# Patient Record
Sex: Female | Born: 1965 | Race: White | Hispanic: No | Smoking: Current every day smoker
Health system: Southern US, Community
[De-identification: ages and names within clinical notes are randomized; demographics above are authoritative.]

## PROBLEM LIST (undated history)

## (undated) DIAGNOSIS — F319 Bipolar disorder, unspecified: Secondary | ICD-10-CM

## (undated) DIAGNOSIS — K219 Gastro-esophageal reflux disease without esophagitis: Secondary | ICD-10-CM

## (undated) DIAGNOSIS — J449 Chronic obstructive pulmonary disease, unspecified: Secondary | ICD-10-CM

## (undated) DIAGNOSIS — R0600 Dyspnea, unspecified: Secondary | ICD-10-CM

## (undated) DIAGNOSIS — F32A Depression, unspecified: Secondary | ICD-10-CM

## (undated) DIAGNOSIS — G4730 Sleep apnea, unspecified: Secondary | ICD-10-CM

## (undated) DIAGNOSIS — I509 Heart failure, unspecified: Secondary | ICD-10-CM

## (undated) DIAGNOSIS — I340 Nonrheumatic mitral (valve) insufficiency: Secondary | ICD-10-CM

## (undated) DIAGNOSIS — J189 Pneumonia, unspecified organism: Secondary | ICD-10-CM

## (undated) DIAGNOSIS — F419 Anxiety disorder, unspecified: Secondary | ICD-10-CM

## (undated) DIAGNOSIS — I10 Essential (primary) hypertension: Secondary | ICD-10-CM

## (undated) DIAGNOSIS — I499 Cardiac arrhythmia, unspecified: Secondary | ICD-10-CM

## (undated) DIAGNOSIS — J45909 Unspecified asthma, uncomplicated: Secondary | ICD-10-CM

## (undated) DIAGNOSIS — M069 Rheumatoid arthritis, unspecified: Secondary | ICD-10-CM

## (undated) DIAGNOSIS — Z789 Other specified health status: Secondary | ICD-10-CM

## (undated) DIAGNOSIS — Z8719 Personal history of other diseases of the digestive system: Secondary | ICD-10-CM

## (undated) DIAGNOSIS — M797 Fibromyalgia: Secondary | ICD-10-CM

## (undated) DIAGNOSIS — F329 Major depressive disorder, single episode, unspecified: Secondary | ICD-10-CM

## (undated) DIAGNOSIS — Z9289 Personal history of other medical treatment: Secondary | ICD-10-CM

## (undated) DIAGNOSIS — D649 Anemia, unspecified: Secondary | ICD-10-CM

## (undated) DIAGNOSIS — R011 Cardiac murmur, unspecified: Secondary | ICD-10-CM

## (undated) DIAGNOSIS — I209 Angina pectoris, unspecified: Secondary | ICD-10-CM

## (undated) DIAGNOSIS — I2510 Atherosclerotic heart disease of native coronary artery without angina pectoris: Secondary | ICD-10-CM

## (undated) HISTORY — PX: CORONARY ANGIOPLASTY: SHX604

## (undated) HISTORY — PX: KNEE SURGERY: SHX244

## (undated) HISTORY — PX: CARDIAC CATHETERIZATION: SHX172

## (undated) HISTORY — PX: TONSILLECTOMY: SUR1361

## (undated) HISTORY — PX: ABDOMINAL HYSTERECTOMY: SHX81

---

## 1996-10-30 ENCOUNTER — Encounter (INDEPENDENT_AMBULATORY_CARE_PROVIDER_SITE_OTHER): Payer: Self-pay | Admitting: *Deleted

## 1996-10-30 LAB — CONVERTED CEMR LAB

## 1998-08-13 ENCOUNTER — Ambulatory Visit (HOSPITAL_COMMUNITY): Admission: RE | Admit: 1998-08-13 | Discharge: 1998-08-13 | Payer: Self-pay | Admitting: General Surgery

## 1998-08-13 ENCOUNTER — Encounter: Payer: Self-pay | Admitting: General Surgery

## 1998-11-02 ENCOUNTER — Encounter: Admission: RE | Admit: 1998-11-02 | Discharge: 1998-11-02 | Payer: Self-pay | Admitting: Family Medicine

## 1998-11-30 ENCOUNTER — Other Ambulatory Visit: Admission: RE | Admit: 1998-11-30 | Discharge: 1998-11-30 | Payer: Self-pay | Admitting: Obstetrics and Gynecology

## 1999-01-25 ENCOUNTER — Encounter: Admission: RE | Admit: 1999-01-25 | Discharge: 1999-01-25 | Payer: Self-pay | Admitting: Family Medicine

## 1999-02-08 ENCOUNTER — Encounter: Admission: RE | Admit: 1999-02-08 | Discharge: 1999-02-08 | Payer: Self-pay | Admitting: Family Medicine

## 1999-08-24 ENCOUNTER — Encounter: Admission: RE | Admit: 1999-08-24 | Discharge: 1999-08-24 | Payer: Self-pay | Admitting: Obstetrics and Gynecology

## 1999-08-24 ENCOUNTER — Encounter: Payer: Self-pay | Admitting: Obstetrics and Gynecology

## 2000-05-11 ENCOUNTER — Encounter: Admission: RE | Admit: 2000-05-11 | Discharge: 2000-05-11 | Payer: Self-pay | Admitting: Family Medicine

## 2000-05-29 ENCOUNTER — Other Ambulatory Visit: Admission: RE | Admit: 2000-05-29 | Discharge: 2000-05-29 | Payer: Self-pay | Admitting: Obstetrics and Gynecology

## 2000-07-24 ENCOUNTER — Encounter: Admission: RE | Admit: 2000-07-24 | Discharge: 2000-07-24 | Payer: Self-pay | Admitting: Sports Medicine

## 2000-09-03 ENCOUNTER — Encounter: Admission: RE | Admit: 2000-09-03 | Discharge: 2000-09-03 | Payer: Self-pay | Admitting: Family Medicine

## 2002-10-05 ENCOUNTER — Emergency Department (HOSPITAL_COMMUNITY): Admission: EM | Admit: 2002-10-05 | Discharge: 2002-10-06 | Payer: Self-pay | Admitting: Emergency Medicine

## 2002-10-06 ENCOUNTER — Encounter: Payer: Self-pay | Admitting: Emergency Medicine

## 2003-07-04 ENCOUNTER — Encounter: Payer: Self-pay | Admitting: Emergency Medicine

## 2003-07-04 ENCOUNTER — Inpatient Hospital Stay (HOSPITAL_COMMUNITY): Admission: EM | Admit: 2003-07-04 | Discharge: 2003-07-07 | Payer: Self-pay | Admitting: Emergency Medicine

## 2004-07-11 ENCOUNTER — Inpatient Hospital Stay (HOSPITAL_COMMUNITY): Admission: AD | Admit: 2004-07-11 | Discharge: 2004-07-15 | Payer: Self-pay | Admitting: Psychiatry

## 2004-07-11 ENCOUNTER — Ambulatory Visit: Payer: Self-pay | Admitting: Psychiatry

## 2006-12-28 ENCOUNTER — Encounter (INDEPENDENT_AMBULATORY_CARE_PROVIDER_SITE_OTHER): Payer: Self-pay | Admitting: *Deleted

## 2010-08-19 ENCOUNTER — Emergency Department (HOSPITAL_COMMUNITY): Admission: EM | Admit: 2010-08-19 | Discharge: 2010-08-20 | Payer: Self-pay | Admitting: Emergency Medicine

## 2010-09-23 ENCOUNTER — Emergency Department (HOSPITAL_COMMUNITY): Admission: EM | Admit: 2010-09-23 | Discharge: 2010-09-23 | Payer: Self-pay | Admitting: Emergency Medicine

## 2010-11-06 ENCOUNTER — Inpatient Hospital Stay (HOSPITAL_COMMUNITY)
Admission: EM | Admit: 2010-11-06 | Discharge: 2010-11-09 | Payer: Self-pay | Source: Home / Self Care | Attending: Internal Medicine | Admitting: Internal Medicine

## 2010-11-14 LAB — BASIC METABOLIC PANEL
BUN: 6 mg/dL (ref 6–23)
BUN: 13 mg/dL (ref 6–23)
CO2: 23 mEq/L (ref 19–32)
CO2: 29 mEq/L (ref 19–32)
Calcium: 9.2 mg/dL (ref 8.4–10.5)
Calcium: 9.3 mg/dL (ref 8.4–10.5)
Chloride: 100 mEq/L (ref 96–112)
Chloride: 99 mEq/L (ref 96–112)
Creatinine, Ser: 0.86 mg/dL (ref 0.4–1.2)
Creatinine, Ser: 0.99 mg/dL (ref 0.4–1.2)
GFR calc Af Amer: >60 mL/min (ref >60)
GFR calc Af Amer: >60 mL/min (ref >60)
GFR calc non Af Amer: >60 mL/min (ref >60)
GFR calc non Af Amer: >60 mL/min (ref >60)
Glucose, Bld: 103 mg/dL — ABNORMAL HIGH (ref 70–99)
Glucose, Bld: 137 mg/dL — ABNORMAL HIGH (ref 70–99)
Potassium: 3.2 mEq/L — ABNORMAL LOW (ref 3.5–5.1)
Potassium: 4.8 mEq/L (ref 3.5–5.1)
Sodium: 134 mEq/L — ABNORMAL LOW (ref 135–145)
Sodium: 136 mEq/L (ref 135–145)

## 2010-11-14 LAB — URINALYSIS, ROUTINE W REFLEX MICROSCOPIC
Bilirubin Urine: NEGATIVE
Hgb urine dipstick: NEGATIVE
Ketones, ur: NEGATIVE mg/dL
Nitrite: NEGATIVE
Protein, ur: NEGATIVE mg/dL
Specific Gravity, Urine: 1.014 (ref 1.005–1.030)
Urine Glucose, Fasting: NEGATIVE mg/dL
Urobilinogen, UA: 0.2 mg/dL (ref 0.0–1.0)
pH: 5.5 (ref 5.0–8.0)

## 2010-11-14 LAB — CBC
HCT: 39.3 % (ref 36.0–46.0)
HCT: 37.5 % (ref 36.0–46.0)
Hemoglobin: 12.9 g/dL (ref 12.0–15.0)
Hemoglobin: 12.2 g/dL (ref 12.0–15.0)
MCH: 30.6 pg (ref 26.0–34.0)
MCH: 30.7 pg (ref 26.0–34.0)
MCHC: 32.8 g/dL (ref 30.0–36.0)
MCHC: 32.5 g/dL (ref 30.0–36.0)
MCV: 93.1 fL (ref 78.0–100.0)
MCV: 94.2 fL (ref 78.0–100.0)
Platelets: 401 10*3/uL — ABNORMAL HIGH (ref 150–400)
Platelets: 395 10*3/uL (ref 150–400)
RBC: 4.22 MIL/uL (ref 3.87–5.11)
RBC: 3.98 MIL/uL (ref 3.87–5.11)
RDW: 13.6 % (ref 11.5–15.5)
RDW: 13.6 % (ref 11.5–15.5)
WBC: 6.3 10*3/uL (ref 4.0–10.5)
WBC: 4.9 10*3/uL (ref 4.0–10.5)

## 2010-11-14 LAB — COMPREHENSIVE METABOLIC PANEL
ALT: 15 U/L (ref 0–35)
AST: 18 U/L (ref 0–37)
Albumin: 3.1 g/dL — ABNORMAL LOW (ref 3.5–5.2)
Alkaline Phosphatase: 93 U/L (ref 39–117)
BUN: 12 mg/dL (ref 6–23)
CO2: 28 mEq/L (ref 19–32)
Calcium: 9.3 mg/dL (ref 8.4–10.5)
Chloride: 100 mEq/L (ref 96–112)
Creatinine, Ser: 0.98 mg/dL (ref 0.4–1.2)
GFR calc Af Amer: >60 mL/min (ref >60)
GFR calc non Af Amer: >60 mL/min (ref >60)
Glucose, Bld: 150 mg/dL — ABNORMAL HIGH (ref 70–99)
Potassium: 4.3 mEq/L (ref 3.5–5.1)
Sodium: 137 mEq/L (ref 135–145)
Total Bilirubin: 0.4 mg/dL (ref 0.3–1.2)
Total Protein: 7.2 g/dL (ref 6.0–8.3)

## 2010-11-14 LAB — DIFFERENTIAL
Basophils Absolute: 0 10*3/uL (ref 0.0–0.1)
Basophils Relative: 0 % (ref 0–1)
Eosinophils Absolute: 0 10*3/uL (ref 0.0–0.7)
Eosinophils Relative: 0 % (ref 0–5)
Lymphocytes Relative: 40 % (ref 12–46)
Lymphs Abs: 2.5 10*3/uL (ref 0.7–4.0)
Monocytes Absolute: 0.9 K/uL (ref 0.1–1.0)
Monocytes Relative: 13 % — ABNORMAL HIGH (ref 3–12)
Neutro Abs: 3 10*3/uL (ref 1.7–7.7)
Neutrophils Relative %: 47 % (ref 43–77)

## 2010-11-14 LAB — POCT PREGNANCY, URINE: Preg Test, Ur: NEGATIVE

## 2010-11-14 LAB — MAGNESIUM: Magnesium: 1.9 mg/dL (ref 1.5–2.5)

## 2010-11-14 LAB — POCT CARDIAC MARKERS
CKMB, poc: <1 ng/mL — ABNORMAL LOW (ref 1.0–8.0)
Myoglobin, poc: 54.7 ng/mL (ref 12–200)
Troponin i, poc: <0.05 ng/mL (ref 0.00–0.09)

## 2010-11-14 LAB — ETHANOL: Alcohol, Ethyl (B): <5 mg/dL (ref 0–10)

## 2010-11-14 LAB — RAPID URINE DRUG SCREEN, HOSP PERFORMED
Amphetamines: NOT DETECTED
Barbiturates: NOT DETECTED
Benzodiazepines: NOT DETECTED
Cocaine: NOT DETECTED
Opiates: POSITIVE — AB
Tetrahydrocannabinol: POSITIVE — AB

## 2010-11-14 LAB — BRAIN NATRIURETIC PEPTIDE: Pro B Natriuretic peptide (BNP): <30 pg/mL (ref 0.0–100.0)

## 2010-11-14 LAB — POTASSIUM: Potassium: 4 mEq/L (ref 3.5–5.1)

## 2010-12-11 NOTE — H&P (Signed)
Lydia [name], MCMANIGAL NO.:  [id number]  MEDICAL RECORD NO.:  [id number]          PATIENT TYPE:  EMS  LOCATION:  MAJO                         FACILITY:  [facility]  PHYSICIAN:  Baltazar Najjar, MD     DATE OF BIRTH:  [birth year]  DATE OF ADMISSION:  11/06/2010 DATE OF DISCHARGE:                             HISTORY & PHYSICAL   PRIMARY CARE PHYSICIAN:  Dr. Alver Sorrow.  CODE STATUS:  The patient is a full code.  HISTORY OF PRESENT ILLNESS:  Ms. Lydia [name] is a 45 year old woman with history of COPD and major depression, presented to the ER today with chief complaints of shortness of breath and cough productive of greenish sputum for a couple of days.  She denies any fever, however, stated that she has cold sweats on and off.  Denies any chest pain.  The patient also stated that she had been having hallucination lately since she ran out of her medications since the loss of insurance.  She described her hallucination as hearing voices and seeing people.  She has no psychiatric; however as per, her she was taking medication for major depressive disorder and she ran out of meds for about 2 weeks. She denies any suicidal ideation, any plans to harm herself at this time.  PAST MEDICAL HISTORY: 1. Significant for COPD. 2. Hypertension. 3. Major depressive disorder. 4. Fibromyalgia. 5. History of CHF. 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  SURGICAL HISTORY:  History of hysterectomy.  SOCIAL HISTORY:  She lives with her son and his girlfriend.  Denies drinking or drug abuse.  She also denies any smoking, however as per ER record, she is a current smoker.  REVIEW OF SYSTEMS:  CHEST:  Significant for shortness of breath and cough productive of greenish sputum as above.  CARDIOVASCULAR:  No chest pain.  She does have palpitation.  ABDOMEN: Complaining of nausea with no vomiting.  No abdominal pain.  No change in her  bowel habits. CONSTITUTIONAL:  Significant for cold sweats without any fever.  GU: No dysuria, no flank pain.  ALLERGIES:  No known drug allergies.  HOME MEDICATION: 1. Effect ER 300 mg q.a.m. 2. Risperdal 2 mg at bedtime. 3. Lasix 20 mg daily. 4. Lisinopril 5 mg daily. 5. Crestor unknown dose. 6. TriCor unknown dose. 7. Flexeril 10 mg b.i.d. p.r.n. 8. Prilosec 20 mg. 9. Cogentin unknown dose. 10.Albuterol inhaler p.r.n. 11.Amitriptyline unknown dose.  PHYSICAL EXAMINATION:  VITAL SIGNS: Blood pressure 129/99, pulse 115, temperature 97.9, O2 sat 99% on 2 L, oxygen via nasal cannula. GENERAL:  She is alert, not in acute distress. LUNGS: She has scattered wheezing noted with no rales. CARDIOVASCULAR:  S1, S2.  Regular rhythm, tachycardic. ABDOMEN: Obese, soft, nontender. EXTREMITIES:  No pedal edema.  LABORATORY DATA:  Troponin less than 0.05.  Urine drug screen is positive for opiates and THC.  Urinalysis unremarkable.  BMP less than 30, potassium 3.2, sodium 134, BUN 6, creatinine 0.8, calcium 9.2, alcohol level less than 5, WBC 6.3, hemoglobin 12.9, hematocrit 39.3, platelet count 401.  Radiology chest x-ray  showed no acute cardiopulmonary abnormalities.  ASSESSMENT/PLAN:  Ms. Lydia [name] is a 45 year old woman with history of chronic obstructive pulmonary disease and major depressive disorder, presented with shortness of breath, productive cough, and hallucinations. 1. Chronic obstructive pulmonary disease exacerbation.  The patient     will be admitted to telemetry floor given her tachycardia.  Her EKG     actually showed sinus tachycardia which is probably secondary to     inhalers.  We will place her on Xopenex inhaler because of     tachycardia q.2 h. p.r.n. for wheezing. 2. We will start Solu-Medrol 40 mg IV q.8 h. 3. We will start Zithromax 500 mg IV daily. 4. Depression with history of major depressive disorder.  The patient     is having symptoms because she  ran out of her meds. We will place     her on her home medications.  We will consult Psychiatry Service in     the morning.  There is no suicidal ideation at this time. 5. Hypertension, currently controlled.  We will continue with her home     regimen and adjust as needed. 6. Fibromyalgia, continue home medications. 7. Code status:  The patient is full code.          ______________________________ Baltazar Najjar, MD     SA/MEDQ  D:  11/06/2010  T:  11/06/2010  Job:  [id number]  cc:   Elby Showers Dr. Clovis Riley  Electronically Signed by Hannah Beat MD on 12/11/2010 02:40:55 PM

## 2011-01-11 LAB — BASIC METABOLIC PANEL
BUN: 2 mg/dL — ABNORMAL LOW (ref 6–23)
CO2: 23 mEq/L (ref 19–32)
Calcium: 8.8 mg/dL (ref 8.4–10.5)
Chloride: 102 mEq/L (ref 96–112)
Creatinine, Ser: 0.77 mg/dL (ref 0.4–1.2)
GFR calc Af Amer: >60 mL/min (ref >60)
GFR calc non Af Amer: >60 mL/min (ref >60)
Glucose, Bld: 112 mg/dL — ABNORMAL HIGH (ref 70–99)
Potassium: 2.7 mEq/L — CL (ref 3.5–5.1)
Sodium: 135 mEq/L (ref 135–145)

## 2011-01-11 LAB — CBC
HCT: 35.1 % — ABNORMAL LOW (ref 36.0–46.0)
Hemoglobin: 12 g/dL (ref 12.0–15.0)
MCH: 32.5 pg (ref 26.0–34.0)
MCHC: 34.2 g/dL (ref 30.0–36.0)
MCV: 95.1 fL (ref 78.0–100.0)
Platelets: 318 10*3/uL (ref 150–400)
RBC: 3.69 MIL/uL — ABNORMAL LOW (ref 3.87–5.11)
RDW: 13.8 % (ref 11.5–15.5)
WBC: 9.1 10*3/uL (ref 4.0–10.5)

## 2011-01-11 LAB — DIFFERENTIAL
Basophils Absolute: 0 10*3/uL (ref 0.0–0.1)
Basophils Relative: 0 % (ref 0–1)
Eosinophils Absolute: 0 10*3/uL (ref 0.0–0.7)
Eosinophils Relative: 0 % (ref 0–5)
Lymphocytes Relative: 35 % (ref 12–46)
Lymphs Abs: 3.2 10*3/uL (ref 0.7–4.0)
Monocytes Absolute: 0.9 10*3/uL (ref 0.1–1.0)
Monocytes Relative: 9 % (ref 3–12)
Neutro Abs: 5 10*3/uL (ref 1.7–7.7)
Neutrophils Relative %: 55 % (ref 43–77)

## 2011-01-11 LAB — POCT CARDIAC MARKERS
CKMB, poc: <1 ng/mL — ABNORMAL LOW (ref 1.0–8.0)
Myoglobin, poc: 84.9 ng/mL (ref 12–200)
Troponin i, poc: <0.05 ng/mL (ref 0.00–0.09)

## 2011-01-11 LAB — RAPID STREP SCREEN (MED CTR MEBANE ONLY): Streptococcus, Group A Screen (Direct): NEGATIVE

## 2011-03-17 NOTE — H&P (Signed)
NAME:  Lydia [name], Lydia [name]                          ACCOUNT NO.:  [id number]   MEDICAL RECORD NO.:  [id number]                   PATIENT TYPE:  EMS   LOCATION:  MAJO                                 FACILITY:  [facility]   PHYSICIAN:  Elliot Cousin, M.D.                 DATE OF BIRTH:  [birth year]   DATE OF ADMISSION:  07/03/2003  DATE OF DISCHARGE:                                HISTORY & PHYSICAL   CHIEF COMPLAINT:  Drug overdose.   HISTORY OF PRESENT ILLNESS:  Ms. Ranes is a 45 year old woman with a past  medical history significant for a past suicide attempt and drug overdose,  who presented to the emergency department via EMS after her daughter found  her unresponsive at home.  The patient is unable to provide the history.  The history is provided by the nursing staff in the emergency department and  the emergency department physician.  The patient has a known history of  depression and prior suicide attempt three to four years ago.  She was  apparently admitted to [facility] or another local hospital  specifically for psychiatric reasons and the prior suicide attempt.  Apparently her past medical history is significant for fibromyalgia as well.  The EMS report is unavailable; however, the patient's daughter retrieved two  pill bottles, one with cyclobenzaprine 10 mg to be taken three times a day.  The original prescription was written on June 03, 2003, and the bottle is  empty.  There were 30 tablets in the bottle dispensed and again now it is  empty.  The other pill bottle found, which was also empty, was clonazepam  0.5 mg to be taken twice a day.  The number dispensed on the clonazepam was  #60 and the date of the original prescription was April 21, 2003.  When the  patient arrived to the emergency department, she was apparently lethargic  but responsive to stimuli.  Apparently she was very agitated when aroused.  The patient threatened the staff verbally and physically when  trying to  subdue the patient, and also when trying to insert a Foley catheter.  Her  vital signs on arrival was a temperature of 96.6, blood pressure 152/75,  pulse of 163, respiratory rate of 24, and oxygen saturation of 100% on 2  liters.  The patient is currently sleeping and snoring, and in no acute  distress.   The patient's urine drug screen is positive for THC and benzodiazepine.   PAST MEDICAL HISTORY:  (Based on history from the emergency department  nursing staff, who interviewed the family)  1. History of suicide attempt approximately three to four years ago     prompting inpatient treatment.  2. Depression.  3. Fibromyalgia.  4. Marijuana use.  5. Tobacco abuse (two to three packs of cigarettes per day).   MEDICATIONS:  1. Flexeril 10 mg t.i.d. p.r.n.  2. Clonazepam 0.5  mg b.i.d.   ALLERGIES:  Unknown at this time.   SOCIAL HISTORY:  Only known social history at this time is that the patient  does have one daughter.  Marital status and place of residence unknown at  this time.   FAMILY HISTORY:  Unavailable.   REVIEW OF SYSTEMS:  Unavailable.   PHYSICAL EXAMINATION:  CURRENT VITALS:  Temperature 96.6, blood pressure  101/62, respiratory rate 16, pulse 115, oxygen saturation 95% on 2 liters  nasal cannula oxygen.  GENERAL:  The patient is a mildly obese 45 year old white female who is  currently sleeping and snoring.  She is in no acute distress.  HEENT:  Head is normocephalic, nontraumatic.  Pupils, when the eyes are  passively opened, are reactive to light, equal and round.  Extraocular  movements could not be assessed.  Conjunctivae are clear.  Sclerae white.  Oropharynx reveals mildly dry mucous membranes, otherwise no lesions seen.  NECK:  Supple without any adenopathy or thyromegaly.  No bruit or JVD.  LUNGS:  Clear to auscultation bilaterally but with decreased breath sounds  in the bases.  HEART:  S1, S2 with tachycardia.  No rubs, no gallops.   ABDOMEN:  Obese, positive bowel sounds, soft, nontender, nondistended, no  hepatosplenomegaly.  GU:  There is a Foley catheter draining yellow urine.  RECTAL:  Deferred.  EXTREMITIES:  The patient's pedal pulses are 2+ bilaterally.  No gross joint  abnormalities.  No pretibial edema.  No pedal edema.  She does have a few  calluses bilaterally of her plantar surfaces.  NEUROLOGIC:  The patient is obtunded.  She will attempt to open her eyes  with stimulation.  She will attempt to move all of her extremities with  stimulation, however, she is in four-point restraints.  Prior to the four-  point restraints the patient was moving her extremities voluntarily and  without any specific movement disorders.  The patient is unresponsive to  verbal stimuli.  She does not follow commands.   ADMISSION LABORATORIES:  Urine drug screen negative opiates, negative  cocaine, positive benzodiazepines, negative barbiturates, negative  amphetamines, positive THC.  Acetaminophen less than 10.  Salicylates less  than 4.  Alcohol less than 5.  Urinalysis is within normal limits.  WBC 10,  hemoglobin 11.8, hematocrit 34.8, MCV 94.8, platelets 295.  Sodium 140,  potassium 2.7, chloride 107, CO2 26, glucose 157, BUN 2, creatinine 0.6,  calcium 8.3, total protein 6.4, albumin 3.3, AST 17, ALT 10, alkaline  phosphatase 74, total bilirubin 0.5.   EKG shows sinus tachycardia with sinus arrhythmia.  QRS 83, QT 321, QTC 440.  Chest x-ray shows low inspiratory volumes, negative for infiltrates.   ASSESSMENT:  1. Multi-drug overdose with probable attempted suicide.  The patient appears     to be protecting her airway without any difficulties.  She is snoring,     however.  She is virtually unresponsive except for stimuli.  She is     oxygenating well on room air initially and now on 2 liters nasal cannula    oxygen.  She is currently tachycardic without QRS widening on EKG and/or     without increase in the QTC  intervals.  The Flexeril will mimic tricyclic     antidepressants in their effects with drug overdose per Poison Control.     Therefore, the Flexeril could potentially cause tachyarrhythmias,     hypertension, and seizures.  The clonazepam will primarily cause sedation     and possibly  respiratory depression.  2. Hypokalemia.  The cause of the hypokalemia is unknown at this time.  3. Mild normocytic anemia.  4. Tachycardia secondary to #1.   PLAN:  1. The patient will be admitted to the Intensive Care Unit for close     observation.  2. The patient was given approximately a liter of normal saline in the     emergency department.  IV fluids will continue with half normal saline     with an amp of bicarbonate and 20 mEq potassium chloride at 75 mL an     hour.  Potassium chloride IV 10 mEq runs will be given simultaneously at     100 mL of normal saline total per hour x5.  3. Will obtain a tricyclic antidepressant level as well as a urine hCG     level.  4. Will obtain neuro checks every two hours x12 hours and then every four     hours thereafter.  5. Will check a magnesium level.  6. Will follow up on the potassium level later on in the day to assess the     extent of the hypokalemia.  7. Will repeat an EKG later on in the morning.  8. Will obtain psychiatric consultation.  9. Will try to obtain more history from the family and significant others     over the next few hours on into the following morning.                                                Elliot Cousin, M.D.    DF/MEDQ  D:  07/04/2003  T:  07/04/2003  Job:  [id number]

## 2011-03-17 NOTE — Discharge Summary (Signed)
NAME:  Lydia [name], Lydia [name]                          ACCOUNT NO.:  [id number]   MEDICAL RECORD NO.:  [id number]                   PATIENT TYPE:  INP   LOCATION:  3023                                 FACILITY:  [facility]   PHYSICIAN:  Elliot Cousin, M.D.                 DATE OF BIRTH:  11-Jun-1966   DATE OF ADMISSION:  07/03/2003  DATE OF DISCHARGE:  07/07/2003                                 DISCHARGE SUMMARY   DISCHARGE DIAGNOSES:  1. Multidrug overdose with Klonopin, Effexor, Flexeril and marijuana.  2. A major depressive episode.  3. Generalized anxiety disorder.  4. Urinary tract infection.  5. Hypokalemia.  6. Tobacco abuse.  7. Marijuana abuse.  8. Chronic normocytic anemia.  9. Allergic rhinitis.  10.      Fibromyalgia.  11.      Lupus diagnosed in March of 2004.  12.      Chronic obstructive pulmonary disease.  13.      Status post bilateral tubal ligation in the past.  14.      Status post total abdominal hysterectomy secondary to endometriosis     in the past.  15.      Status post bladder surgery in the past.   DISCHARGE MEDICATIONS:  1. Effexor XR 75 mg daily for three to seven days and then increase by 75 mg     daily for a total of 300 mg per day.  2. Levaquin 500 mg daily x3 days.  3. Flonase nasal spray one spray in each nostril twice a day.   DISCHARGE DISPOSITION:  Lydia [name] was discharged to home on July 07, 2003 in improved and stable condition.  She will follow up with her primary  care physician, Cheri Rous, M.D. in one week.  She was advised to follow  up with her scheduled appointment at mental health on July 09, 2003 at  12:30 p.m.   CONSULTATIONS:  Antonietta Breach, M.D., psychiatrist.   HISTORY OF PRESENT ILLNESS:  Lydia [name] is a 45 year old woman with a past  medical history significant for previous suicide attempt with a drug  overdose, depression, and fibromyalgia who presented to the emergency  department on July 03, 2003 via EMS  after her daughter found her  unresponsive at home.  The patient was unable to provide a history during  the initial assessment.  When she was evaluated in the emergency department,  she was somewhat lethargic.  However, with stimulation she became  belligerent and actually verbally and physically threatening to the  emergency department staff.  When the patient was not stimulated she was  virtually unresponsive.  Her airway was managed without any intervention.  She was oxygenating in the upper 90s on room air.  She was afebrile on exam  initially in the emergency department.  Her blood pressure initially was  152/75 in the emergency department.  Her pulse was initially  163 with a  respiratory rate of 24.  The urine drug screen was positive for THC and  benzodiazepines.  The serum acetaminophen level was less than 10.  The  salicylate level was less than 4.  The urine tricyclic level was positive.  The patient's initial EKG revealed sinus tachycardia with a QRS of 83, a QT  of 321 and a QTc of 440.  The patient was therefore admitted to the ICU for  evaluation and management of multidrug overdose.   HOSPITAL COURSE:  Problem 1.  MULTIDRUG OVERDOSE SECONDARY TO MAJOR  DEPRESSION:  As stated above, the patient's urine drug screen was positive  for benzodiazepines, tricyclic antidepressant, and THC.  When the patient  became more alert, she admitted to being angry with life.  She admitted  taking 16 Klonopin, 4 Effexor, 1 Mobic, and 10 Flexeril.  The initial  management started in the emergency department where the patient was given a  liter of normal saline.  The patient was continued treatment on IV fluids in  the ICU with 1/2 normal saline with an ampere of bicarbonate and 20 mEq  potassium chloride.  Per poison control, the Flexeril can mimic a tricyclic  antidepressant in its effect.  The Effexor also currently had tricyclic  effects as well.  The patient's EKG was monitored twice  during the first 24  hours and daily thereafter.  She was treated supportively with oxygen  therapy and neurological checks every two hours x12 hours and then every  four hours x12 hours.  She was also provided with a sitter.   The initial EKG revealed a QRS of 83, a QT of 321, and a QTc of 440.  However, with the next EKG the patient's QRS duration increased to 86, the  QT increased to 424, and the QTc increased to 486.  Given the findings of  the repeat EKG, the IV fluids were changed to 1/4 normal saline with two  amperes of bicarb at 100 mL an hour.  On hospital day #3, the QRS slightly  decreased to 84 and the QT decreased to 422 and the QTc decreased to 471.   On hospital day #2 late in the evening, the patient became more alert and  less lethargic.  On initial exam which is located in the History and  Physical, the patient was somewhat obtunded and only responded to aggressive  stimulation.  During the later hours of hospital day #2, the patient became  more alert and talkative.  She was eventually transferred out of the ICU.  A  psychiatry consult was obtained from Antonietta Breach, M.D.  Dr. Providence Crosby  assessment occurred on hospital day #3.  Per his assessment the patient was  no longer at risk of harming herself, in fact she wanted to go back home to  her 47 year old.  She voiced regret regarding the attempted suicide.  Dr.  Jeanie Sewer did recommend inpatient psychiatric evaluation.  However, the  patient desired to care for her 47 year old.  The patient was subsequently  set up for outpatient treatment with mental health per Dr. Jeanie Sewer.  The  patient was scheduled to be seen two days after discharge.  Prior to  hospital discharge, the patient was alert and oriented.  She was restarted  on the Effexor at 75 mg daily.  Per Dr. Providence Crosby recommendations, the  Effexor should be titrated upward each day by 75 mg for a total of 300 mg during the first seven days.  The patient  was advised to follow up with her  primary care physician in one week to monitor her blood pressures on the  increased dose of Effexor.  The patient remained hemodynamically stable  during the hospitalization.  Her respiratory rate, heart rate, and blood  pressures all normalized during the hospital stay.  It is important to note  that a urine pregnancy test was assessed during the hospitalization and it  was negative.   Problem 2.  HYPOKALEMIA:  The patient's initial potassium level was 2.7.  The etiology of the hypokalemia is unknown and was unknown during  hospitalization.  The patient had no previous history of therapy with  diuretics.  A magnesium level was assessed and found to be within normal  limits at 2.1.  The patient was repleted with potassium intravenously for  the first 24 hours and by mouth thereafter.  The patient's potassium prior  to hospital discharge was 3.8.   Problem 3.  NORMOCYTIC ANEMIA:  The patient's hemoglobin ranged between 10.3  and 11.8 during hospital course.  The hematocrit ranged between 30.6 and  34.8 during the hospitalization.  She is status post a total abdominal  hysterectomy in the past, therefore she does not have any menstrual blood  flow.  No further evaluation or treatment were given during the  hospitalization.  However, an outpatient colonoscopy is recommended.   Problem 4.  URINARY TRACT INFECTION:  The patient did have a Foley catheter  for the first 48 hours of hospitalization.  When it was discontinued, the  patient complained of mild dysuria.  A urinalysis revealed many  bacteria but negative for urine leukocyte esterase and negative for  nitrites.  She did have 3-6 urine white blood cells, however.  The patient  was started on Levaquin 500 mg daily.  She was advised to continue the  Levaquin for an additional three days in the outpatient setting.                                                Elliot Cousin, M.D.    DF/MEDQ  D:   07/11/2003  T:  07/12/2003  Job:  [id number]   cc:   Cheri Rous  [address]  [place]  [place] [address]  Fax: [phone]
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
## 2011-08-25 ENCOUNTER — Encounter (HOSPITAL_COMMUNITY)
Admission: RE | Admit: 2011-08-25 | Discharge: 2011-08-25 | Disposition: A | Discharge status: Home / Self Care | Payer: Medicare Other | Source: Ambulatory Visit | Attending: Oral Surgery | Admitting: Oral Surgery

## 2011-08-25 LAB — BASIC METABOLIC PANEL
CO2: 28 mEq/L (ref 19–32)
Chloride: 106 mEq/L (ref 96–112)
Creatinine, Ser: 0.83 mg/dL (ref 0.50–1.10)
Glucose, Bld: 92 mg/dL (ref 70–99)

## 2011-08-25 LAB — CBC
HCT: 35.2 % — ABNORMAL LOW (ref 36.0–46.0)
Hemoglobin: 11.4 g/dL — ABNORMAL LOW (ref 12.0–15.0)
MCV: 98.3 fL (ref 78.0–100.0)
Platelets: 337 10*3/uL (ref 150–400)
RBC: 3.58 MIL/uL — ABNORMAL LOW (ref 3.87–5.11)
WBC: 10.3 10*3/uL (ref 4.0–10.5)

## 2011-08-25 LAB — SURGICAL PCR SCREEN: MRSA, PCR: NEGATIVE

## 2011-08-28 ENCOUNTER — Ambulatory Visit (HOSPITAL_COMMUNITY)
Admission: RE | Admit: 2011-08-28 | Discharge: 2011-08-28 | Disposition: A | Discharge status: Home / Self Care | Payer: Medicare Other | Source: Ambulatory Visit | Attending: Oral Surgery | Admitting: Oral Surgery

## 2011-08-28 DIAGNOSIS — G8929 Other chronic pain: Secondary | ICD-10-CM | POA: Insufficient documentation

## 2011-08-28 DIAGNOSIS — I10 Essential (primary) hypertension: Secondary | ICD-10-CM | POA: Insufficient documentation

## 2011-08-28 DIAGNOSIS — K029 Dental caries, unspecified: Secondary | ICD-10-CM | POA: Insufficient documentation

## 2011-08-28 DIAGNOSIS — Z01812 Encounter for preprocedural laboratory examination: Secondary | ICD-10-CM | POA: Insufficient documentation

## 2011-08-28 DIAGNOSIS — J449 Chronic obstructive pulmonary disease, unspecified: Secondary | ICD-10-CM | POA: Insufficient documentation

## 2011-08-28 DIAGNOSIS — J4489 Other specified chronic obstructive pulmonary disease: Secondary | ICD-10-CM | POA: Insufficient documentation

## 2011-08-28 NOTE — Op Note (Signed)
NAME:  Lydia [name], Lydia [name]                ACCOUNT NO.:  [id number]  MEDICAL RECORD NO.:  [id number]  LOCATION:  SDSC                         FACILITY:  [facility]  PHYSICIAN:  Georgia Lopes, M.D.  DATE OF BIRTH:  Mar 15, 1966  DATE OF PROCEDURE:  08/28/2011 DATE OF DISCHARGE:                              OPERATIVE REPORT   PREOPERATIVE DIAGNOSES:  Nonrestorable teeth numbers 12, 13, 14, 15, 21, 22, 23, 25, 26, 27, 28, 29, maxillary alveolar bone contour irregularity.  POSTOPERATIVE DIAGNOSES:  Nonrestorable teeth numbers 12, 13, 14, 15, 21, 22, 23, 25, 26, 27, 28, 29, maxillary alveolar bone contour irregularity.  PROCEDURE:  Extraction of teeth numbers 12, 13, 14, 15, 21, 22, 23, 25, 26, 27, 28, 29, alveoplasty right and left maxilla and mandible.  SURGEON:  Georgia Lopes, MD  ANESTHESIA:  General nasal.  ASSISTANTS:  Fraser Din and Lodema Hong.  INDICATIONS FOR PROCEDURE:  [name] is a 45 year old female who has morbid obesity, COPD, hypertension, chronic pain, fibromyalgia, history of angina, chest pain, migraines, congestive heart failure, hiatal hernia, reflux GI ulcer with bleeding, history of renal failure, depression, and psychiatric suicide attempts.  She was referred to me by general dentist for removal of all remaining teeth in preparation for dentures.  Because of her significant past medical history and the extensiveness of the __________ performed, I recommended that she undergo general anesthesia with airway protection via endotracheal tube.  PROCEDURE:  The patient was taken to the operating room, placed on the table in supine position.  General anesthesia was administered intravenously and a nasal endotracheal tube was placed and secured.  The eyes were protected.  The patient was draped for the procedure.  The posterior pharynx was suctioned.  The throat pack was placed.  A 2% lidocaine 1:100,000 epinephrine was infiltrated in the inferior alveolar block on the right and  left side and buccal and palatal infiltration in the maxilla, total of 17 mL was utilized.  A bite block was placed on the right side of the mouth and a sweetheart retractor was used to retract the tongue.  A #15 blade was used to make full-thickness incision beginning approximately 1 cm proximal to tooth #21 in a wedge fashion and carrying buccally and lingually around the teeth until the area of tooth #29 with another wedge incision proximal on this side as well.  The periosteum was reflected with a periosteal elevator and then a #15 blade was used to make an incision around teeth numbers 12, 13, 14, 15 with the proximal and distal wedge.  The periosteum was reflected there as well.  Then the Stryker handpiece with fissure bur and irrigation was used to remove bone in approximately between teeth #12, 13, 14, 15 and in the mandible to remove bone around teeth #21, 22. Then the teeth were elevated with a 301 elevator.  The upper teeth were removed with a #150 forceps and the lower teeth were removed with the Asch forceps.  Then the sockets were curetted.  The periosteum was further reflected to expose the alveolar bone and the egg-shaped bur and the bone file were used to perform the alveoplasty.  Then the areas were irrigated and  closed with 3-0 chromic.  The bite block and sweetheart were repositioned and attention was turned to the right maxilla.  A #15 blade was used to make a full-thickness crystal incision beginning in the area of the second molar, which had previously been extracted and carrying anteriorly towards the midline.  The periosteum was reflected. Then the egg-shaped bur was used to smooth the bone and the bone file was also used to perform the alveoplasty.  Then, the area was irrigated and closed with 3-0 chromic.  Then the oral cavity was inspected, found to have good contour, closure, and hemostasis.  Oral cavity was irrigated, suctioned.  The throat pack was removed  and the posterior pharynx was suctioned.  The patient was awakened and taken to the recovery room breathing spontaneously in good condition.  ESTIMATED BLOOD LOSS:  Minimum.  COMPLICATIONS:  None.  SPECIMENS:  None.     Georgia Lopes, M.D.     SMJ/MEDQ  D:  08/28/2011  T:  08/28/2011  Job:  [id number]  Electronically Signed by Ocie Doyne M.D. on 08/28/2011 10:10:56 AM

## 2014-05-15 ENCOUNTER — Telehealth: Payer: Self-pay | Admitting: Internal Medicine

## 2014-05-15 NOTE — Telephone Encounter (Signed)
error 

## 2014-09-09 ENCOUNTER — Encounter: Payer: Self-pay | Admitting: *Deleted

## 2014-09-12 ENCOUNTER — Encounter (HOSPITAL_COMMUNITY): Payer: Self-pay

## 2014-09-12 ENCOUNTER — Emergency Department (HOSPITAL_COMMUNITY)
Admission: EM | Admit: 2014-09-12 | Discharge: 2014-09-12 | Disposition: A | Discharge status: Home / Self Care | Payer: No Typology Code available for payment source | Attending: Emergency Medicine | Admitting: Emergency Medicine

## 2014-09-12 DIAGNOSIS — Y998 Other external cause status: Secondary | ICD-10-CM | POA: Insufficient documentation

## 2014-09-12 DIAGNOSIS — Z720 Tobacco use: Secondary | ICD-10-CM | POA: Insufficient documentation

## 2014-09-12 DIAGNOSIS — Y92410 Unspecified street and highway as the place of occurrence of the external cause: Secondary | ICD-10-CM | POA: Insufficient documentation

## 2014-09-12 DIAGNOSIS — I509 Heart failure, unspecified: Secondary | ICD-10-CM | POA: Diagnosis not present

## 2014-09-12 DIAGNOSIS — Y9389 Activity, other specified: Secondary | ICD-10-CM | POA: Insufficient documentation

## 2014-09-12 DIAGNOSIS — R519 Headache, unspecified: Secondary | ICD-10-CM

## 2014-09-12 DIAGNOSIS — S39012A Strain of muscle, fascia and tendon of lower back, initial encounter: Secondary | ICD-10-CM

## 2014-09-12 DIAGNOSIS — J449 Chronic obstructive pulmonary disease, unspecified: Secondary | ICD-10-CM | POA: Diagnosis not present

## 2014-09-12 DIAGNOSIS — R51 Headache: Secondary | ICD-10-CM

## 2014-09-12 HISTORY — DX: Chronic obstructive pulmonary disease, unspecified: J44.9

## 2014-09-12 HISTORY — DX: Fibromyalgia: M79.7

## 2014-09-12 HISTORY — DX: Heart failure, unspecified: I50.9

## 2014-09-12 MED ORDER — CYCLOBENZAPRINE HCL 10 MG PO TABS: 10.0000 mg | ORAL_TABLET | Freq: Two times a day (BID) | ORAL | Status: DC | PRN

## 2014-09-12 MED ORDER — HYDROCODONE-ACETAMINOPHEN 5-325 MG PO TABS: 1.0000 | ORAL_TABLET | Freq: Four times a day (QID) | ORAL | Status: DC | PRN
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
## 2014-09-23 NOTE — Telephone Encounter (Signed)
This encounter was created in error - please disregard.

## 2016-01-29 ENCOUNTER — Inpatient Hospital Stay (HOSPITAL_COMMUNITY)
Admission: EM | Admit: 2016-01-29 | Discharge: 2016-02-01 | DRG: 872 | Disposition: A | Discharge status: Home / Self Care | Payer: Medicare Other | Attending: Internal Medicine | Admitting: Internal Medicine

## 2016-01-29 ENCOUNTER — Emergency Department (HOSPITAL_COMMUNITY): Payer: Medicare Other

## 2016-01-29 ENCOUNTER — Encounter (HOSPITAL_COMMUNITY): Payer: Self-pay | Admitting: Emergency Medicine

## 2016-01-29 DIAGNOSIS — I959 Hypotension, unspecified: Secondary | ICD-10-CM | POA: Diagnosis present

## 2016-01-29 DIAGNOSIS — A419 Sepsis, unspecified organism: Secondary | ICD-10-CM | POA: Diagnosis present

## 2016-01-29 DIAGNOSIS — M797 Fibromyalgia: Secondary | ICD-10-CM | POA: Diagnosis present

## 2016-01-29 DIAGNOSIS — R059 Cough, unspecified: Secondary | ICD-10-CM | POA: Insufficient documentation

## 2016-01-29 DIAGNOSIS — Z79899 Other long term (current) drug therapy: Secondary | ICD-10-CM

## 2016-01-29 DIAGNOSIS — J209 Acute bronchitis, unspecified: Secondary | ICD-10-CM | POA: Diagnosis present

## 2016-01-29 DIAGNOSIS — J9620 Acute and chronic respiratory failure, unspecified whether with hypoxia or hypercapnia: Secondary | ICD-10-CM

## 2016-01-29 DIAGNOSIS — J440 Chronic obstructive pulmonary disease with acute lower respiratory infection: Secondary | ICD-10-CM | POA: Diagnosis present

## 2016-01-29 DIAGNOSIS — J441 Chronic obstructive pulmonary disease with (acute) exacerbation: Secondary | ICD-10-CM | POA: Diagnosis present

## 2016-01-29 DIAGNOSIS — F17210 Nicotine dependence, cigarettes, uncomplicated: Secondary | ICD-10-CM | POA: Diagnosis present

## 2016-01-29 DIAGNOSIS — E876 Hypokalemia: Secondary | ICD-10-CM | POA: Diagnosis present

## 2016-01-29 DIAGNOSIS — R197 Diarrhea, unspecified: Secondary | ICD-10-CM | POA: Diagnosis present

## 2016-01-29 DIAGNOSIS — Z7982 Long term (current) use of aspirin: Secondary | ICD-10-CM

## 2016-01-29 DIAGNOSIS — Z886 Allergy status to analgesic agent status: Secondary | ICD-10-CM

## 2016-01-29 DIAGNOSIS — E785 Hyperlipidemia, unspecified: Secondary | ICD-10-CM | POA: Diagnosis present

## 2016-01-29 DIAGNOSIS — I10 Essential (primary) hypertension: Secondary | ICD-10-CM | POA: Diagnosis present

## 2016-01-29 DIAGNOSIS — J9610 Chronic respiratory failure, unspecified whether with hypoxia or hypercapnia: Secondary | ICD-10-CM | POA: Diagnosis not present

## 2016-01-29 DIAGNOSIS — R05 Cough: Secondary | ICD-10-CM

## 2016-01-29 DIAGNOSIS — I2510 Atherosclerotic heart disease of native coronary artery without angina pectoris: Secondary | ICD-10-CM

## 2016-01-29 DIAGNOSIS — R0602 Shortness of breath: Secondary | ICD-10-CM | POA: Diagnosis present

## 2016-01-29 DIAGNOSIS — F329 Major depressive disorder, single episode, unspecified: Secondary | ICD-10-CM | POA: Diagnosis present

## 2016-01-29 DIAGNOSIS — J9690 Respiratory failure, unspecified, unspecified whether with hypoxia or hypercapnia: Secondary | ICD-10-CM | POA: Diagnosis present

## 2016-01-29 DIAGNOSIS — J9611 Chronic respiratory failure with hypoxia: Secondary | ICD-10-CM | POA: Diagnosis not present

## 2016-01-29 DIAGNOSIS — J111 Influenza due to unidentified influenza virus with other respiratory manifestations: Secondary | ICD-10-CM | POA: Diagnosis present

## 2016-01-29 HISTORY — DX: Depression, unspecified: F32.A

## 2016-01-29 HISTORY — DX: Major depressive disorder, single episode, unspecified: F32.9

## 2016-01-29 LAB — CBC WITH DIFFERENTIAL/PLATELET
BASOS ABS: 0 10*3/uL (ref 0.0–0.1)
Basophils Relative: 0 %
EOS ABS: 0 10*3/uL (ref 0.0–0.7)
Eosinophils Relative: 0 %
HCT: 34.6 % — ABNORMAL LOW (ref 36.0–46.0)
Hemoglobin: 11.9 g/dL — ABNORMAL LOW (ref 12.0–15.0)
LYMPHS ABS: 1.3 10*3/uL (ref 0.7–4.0)
Lymphocytes Relative: 16 %
MCH: 31.7 pg (ref 26.0–34.0)
MCHC: 34.4 g/dL (ref 30.0–36.0)
MCV: 92.3 fL (ref 78.0–100.0)
Monocytes Absolute: 0.9 10*3/uL (ref 0.1–1.0)
Monocytes Relative: 11 %
NEUTROS ABS: 5.9 10*3/uL (ref 1.7–7.7)
Neutrophils Relative %: 73 %
Platelets: 248 10*3/uL (ref 150–400)
RBC: 3.75 MIL/uL — ABNORMAL LOW (ref 3.87–5.11)
RDW: 12.9 % (ref 11.5–15.5)
WBC: 8.1 10*3/uL (ref 4.0–10.5)

## 2016-01-29 LAB — BASIC METABOLIC PANEL
Anion gap: 13 (ref 5–15)
BUN: 10 mg/dL (ref 6–20)
CALCIUM: 8.7 mg/dL — AB (ref 8.9–10.3)
CO2: 22 mmol/L (ref 22–32)
CREATININE: 1.25 mg/dL — AB (ref 0.44–1.00)
Chloride: 98 mmol/L — ABNORMAL LOW (ref 101–111)
GFR calc non Af Amer: 49 mL/min — ABNORMAL LOW (ref >60)
GFR, EST AFRICAN AMERICAN: 57 mL/min — AB (ref >60)
Glucose, Bld: 149 mg/dL — ABNORMAL HIGH (ref 65–99)
Potassium: 2.9 mmol/L — ABNORMAL LOW (ref 3.5–5.1)
SODIUM: 133 mmol/L — AB (ref 135–145)

## 2016-01-29 LAB — MRSA PCR SCREENING: MRSA by PCR: NEGATIVE

## 2016-01-29 LAB — I-STAT CG4 LACTIC ACID, ED
LACTIC ACID, VENOUS: 3.05 mmol/L — AB (ref 0.5–2.0)
LACTIC ACID, VENOUS: 0.69 mmol/L (ref 0.5–2.0)

## 2016-01-29 LAB — INFLUENZA PANEL BY PCR (TYPE A & B)
H1N1FLUPCR: NOT DETECTED
INFLAPCR: POSITIVE — AB
INFLBPCR: NEGATIVE

## 2016-01-29 MED ORDER — NICOTINE 21 MG/24HR TD PT24
21.0000 mg | MEDICATED_PATCH | Freq: Every day | TRANSDERMAL | Status: DC
  Administered 2016-01-29 – 2016-02-01 (×4): 21 mg via TRANSDERMAL
  Filled 2016-01-29 (×4): qty 1

## 2016-01-29 MED ORDER — SODIUM CHLORIDE 0.9 % IV BOLUS (SEPSIS)
1000.0000 mL | Freq: Once | INTRAVENOUS | Status: AC
  Administered 2016-01-29: 1000 mL via INTRAVENOUS

## 2016-01-29 MED ORDER — METHYLPREDNISOLONE SODIUM SUCC 125 MG IJ SOLR
60.0000 mg | Freq: Two times a day (BID) | INTRAMUSCULAR | Status: DC
  Administered 2016-01-29 – 2016-01-30 (×4): 60 mg via INTRAVENOUS
  Filled 2016-01-29 (×4): qty 2

## 2016-01-29 MED ORDER — LEVOFLOXACIN 750 MG PO TABS
750.0000 mg | ORAL_TABLET | Freq: Once | ORAL | Status: AC
  Administered 2016-01-29: 750 mg via ORAL
  Filled 2016-01-29: qty 1

## 2016-01-29 MED ORDER — AZITHROMYCIN 250 MG PO TABS
250.0000 mg | ORAL_TABLET | Freq: Every day | ORAL | Status: DC
  Administered 2016-01-31 – 2016-02-01 (×2): 250 mg via ORAL
  Filled 2016-01-29 (×2): qty 1

## 2016-01-29 MED ORDER — AZITHROMYCIN 250 MG PO TABS
500.0000 mg | ORAL_TABLET | Freq: Every day | ORAL | Status: DC
  Administered 2016-01-29: 500 mg via ORAL

## 2016-01-29 MED ORDER — MORPHINE SULFATE (PF) 4 MG/ML IV SOLN
4.0000 mg | INTRAVENOUS | Status: DC | PRN
  Administered 2016-01-29: 4 mg via INTRAVENOUS
  Filled 2016-01-29: qty 1

## 2016-01-29 MED ORDER — GUAIFENESIN ER 600 MG PO TB12
600.0000 mg | ORAL_TABLET | Freq: Two times a day (BID) | ORAL | Status: DC
  Administered 2016-01-29 – 2016-02-01 (×6): 600 mg via ORAL
  Filled 2016-01-29 (×6): qty 1

## 2016-01-29 MED ORDER — ENOXAPARIN SODIUM 60 MG/0.6ML ~~LOC~~ SOLN
0.5000 mg/kg | SUBCUTANEOUS | Status: DC
  Administered 2016-01-29 – 2016-01-31 (×3): 50 mg via SUBCUTANEOUS
  Filled 2016-01-29 (×4): qty 0.6

## 2016-01-29 MED ORDER — ACETAMINOPHEN 325 MG PO TABS
650.0000 mg | ORAL_TABLET | ORAL | Status: DC | PRN
  Administered 2016-02-01: 650 mg via ORAL
  Filled 2016-01-29: qty 2

## 2016-01-29 MED ORDER — POTASSIUM CHLORIDE CRYS ER 20 MEQ PO TBCR
40.0000 meq | EXTENDED_RELEASE_TABLET | Freq: Once | ORAL | Status: AC
  Administered 2016-01-30: 40 meq via ORAL
  Filled 2016-01-29: qty 2

## 2016-01-29 MED ORDER — MAGNESIUM SULFATE IN D5W 10-5 MG/ML-% IV SOLN
1.0000 g | Freq: Once | INTRAVENOUS | Status: AC
  Administered 2016-01-29: 1 g via INTRAVENOUS
  Filled 2016-01-29: qty 100

## 2016-01-29 MED ORDER — ACETAMINOPHEN 500 MG PO TABS
1000.0000 mg | ORAL_TABLET | Freq: Once | ORAL | Status: AC
  Administered 2016-01-29: 1000 mg via ORAL
  Filled 2016-01-29: qty 2

## 2016-01-29 MED ORDER — SODIUM CHLORIDE 0.9 % IV SOLN
INTRAVENOUS | Status: DC
  Administered 2016-01-29 – 2016-01-31 (×3): via INTRAVENOUS

## 2016-01-29 MED ORDER — POTASSIUM CHLORIDE 10 MEQ/100ML IV SOLN
10.0000 meq | INTRAVENOUS | Status: DC
Start: 2016-01-29 — End: 2016-01-29
  Administered 2016-01-29 (×2): 10 meq via INTRAVENOUS
  Filled 2016-01-29 (×2): qty 100

## 2016-01-29 MED ORDER — MORPHINE SULFATE (PF) 2 MG/ML IV SOLN
2.0000 mg | INTRAVENOUS | Status: DC | PRN
  Administered 2016-01-29 – 2016-02-01 (×10): 2 mg via INTRAVENOUS
  Filled 2016-01-29 (×10): qty 1

## 2016-01-29 MED ORDER — SODIUM CHLORIDE 0.9% FLUSH
3.0000 mL | Freq: Two times a day (BID) | INTRAVENOUS | Status: DC
  Administered 2016-01-29 – 2016-02-01 (×4): 3 mL via INTRAVENOUS

## 2016-01-29 MED ORDER — OSELTAMIVIR PHOSPHATE 75 MG PO CAPS
75.0000 mg | ORAL_CAPSULE | Freq: Two times a day (BID) | ORAL | Status: DC
  Administered 2016-01-29 – 2016-02-01 (×6): 75 mg via ORAL
  Filled 2016-01-29 (×8): qty 1

## 2016-01-29 MED ORDER — AZITHROMYCIN 250 MG PO TABS: 250.0000 mg | ORAL_TABLET | Freq: Every day | ORAL | Status: DC

## 2016-01-29 MED ORDER — AZITHROMYCIN 250 MG PO TABS
500.0000 mg | ORAL_TABLET | Freq: Every day | ORAL | Status: AC
  Administered 2016-01-30: 500 mg via ORAL
  Filled 2016-01-29 (×2): qty 2

## 2016-01-29 MED ORDER — ONDANSETRON HCL 4 MG/2ML IJ SOLN
4.0000 mg | Freq: Four times a day (QID) | INTRAMUSCULAR | Status: DC | PRN
  Administered 2016-01-29: 4 mg via INTRAVENOUS
  Filled 2016-01-29: qty 2

## 2016-01-29 MED ORDER — SENNOSIDES-DOCUSATE SODIUM 8.6-50 MG PO TABS: 1.0000 | ORAL_TABLET | Freq: Every evening | ORAL | Status: DC | PRN

## 2016-01-29 MED ORDER — ALBUTEROL SULFATE (2.5 MG/3ML) 0.083% IN NEBU
2.5000 mg | INHALATION_SOLUTION | RESPIRATORY_TRACT | Status: DC | PRN
  Administered 2016-01-29 – 2016-01-31 (×3): 2.5 mg via RESPIRATORY_TRACT
  Filled 2016-01-29 (×4): qty 3

## 2016-01-29 MED ORDER — DEXTROSE 5 % IV SOLN
1.0000 g | INTRAVENOUS | Status: DC
  Administered 2016-01-29 – 2016-01-31 (×3): 1 g via INTRAVENOUS
  Filled 2016-01-29 (×3): qty 10

## 2016-01-29 MED ORDER — METHYLPREDNISOLONE SODIUM SUCC 125 MG IJ SOLR
125.0000 mg | Freq: Once | INTRAMUSCULAR | Status: AC
  Administered 2016-01-29: 125 mg via INTRAVENOUS
  Filled 2016-01-29: qty 2

## 2016-01-29 MED ORDER — ONDANSETRON HCL 4 MG/2ML IJ SOLN
4.0000 mg | Freq: Once | INTRAMUSCULAR | Status: AC
  Administered 2016-01-29: 4 mg via INTRAVENOUS
  Filled 2016-01-29: qty 2

## 2016-01-29 MED ORDER — POTASSIUM CHLORIDE CRYS ER 20 MEQ PO TBCR
40.0000 meq | EXTENDED_RELEASE_TABLET | Freq: Once | ORAL | Status: AC
  Administered 2016-01-29: 40 meq via ORAL
  Filled 2016-01-29: qty 2

## 2016-01-29 MED ORDER — POTASSIUM CHLORIDE 10 MEQ/100ML IV SOLN
10.0000 meq | Freq: Once | INTRAVENOUS | Status: AC
  Administered 2016-01-29: 10 meq via INTRAVENOUS
  Filled 2016-01-29: qty 100

## 2016-01-29 MED ORDER — SODIUM CHLORIDE 0.9 % IV BOLUS (SEPSIS)
500.0000 mL | Freq: Once | INTRAVENOUS | Status: AC
Start: 2016-01-29 — End: 2016-01-29
  Administered 2016-01-29: 500 mL via INTRAVENOUS

## 2016-01-29 NOTE — ED Notes (Signed)
Pt complaint of productive cough, fever, and generalized body aches onset a week ago.

## 2016-01-29 NOTE — ED Provider Notes (Signed)
CSN: [id number]     Arrival date & time 01/29/16  1219 History   First MD Initiated Contact with Patient 01/29/16 1224     Chief Complaint  Patient presents with  . Flu Like Symptoms      HPI  Patient resists evaluation of a 5 or 6 day illness. Symptoms including body aches, shakes and chills. Cough productive some thin sputum. No chest pain. Has diffuse myalgias. She did receive flu vaccine. History of COPD. Not on home oxygen. Uses inhalers when necessary.  Nausea but no vomiting. She has history of laparoscopic Nissen fundoplication. Loose stools no bloody stools.  Past Medical History  Diagnosis Date  . COPD (chronic obstructive pulmonary disease) (HCC)   . Fibromyalgia   . CHF (congestive heart failure) (HCC)   . Depression    Past Surgical History  Procedure Laterality Date  . Abdominal hysterectomy    . Knee surgery Left   . Tonsillectomy     History reviewed. No pertinent family history. Social History  Substance Use Topics  . Smoking status: Current Every Day Smoker -- 1.00 packs/day    Types: Cigarettes  . Smokeless tobacco: None  . Alcohol Use: No   OB History    No data available     Review of Systems  Constitutional: Positive for fever, chills and fatigue. Negative for diaphoresis and appetite change.  HENT: Negative for mouth sores, sore throat and trouble swallowing.   Eyes: Negative for visual disturbance.  Respiratory: Positive for cough and shortness of breath. Negative for chest tightness and wheezing.   Cardiovascular: Negative for chest pain.  Gastrointestinal: Positive for nausea and diarrhea. Negative for vomiting, abdominal pain and abdominal distention.  Endocrine: Negative for polydipsia, polyphagia and polyuria.  Genitourinary: Negative for dysuria, frequency and hematuria.  Musculoskeletal: Negative for gait problem.  Skin: Negative for color change, pallor and rash.  Neurological: Negative for dizziness, syncope, light-headedness and  headaches.  Hematological: Does not bruise/bleed easily.  Psychiatric/Behavioral: Negative for behavioral problems and confusion.      Allergies  Nsaids  Home Medications   Prior to Admission medications   Medication Sig Start Date End Date Taking? Authorizing Provider  ALPRAZolam Prudy Feeler) 1 MG tablet Take 1 mg by mouth at bedtime as needed for sleep.    Yes Historical Provider, MD  amitriptyline (ELAVIL) 100 MG tablet Take 100 mg by mouth at bedtime.    Yes Historical Provider, MD  benztropine (COGENTIN) 0.5 MG tablet Take 0.5 mg by mouth 2 (two) times daily.    Yes Historical Provider, MD  cyclobenzaprine (FLEXERIL) 10 MG tablet Take 10 mg by mouth 3 (three) times daily as needed for muscle spasms.   Yes Historical Provider, MD  esomeprazole (NEXIUM) 40 MG capsule Take 40 mg by mouth every morning.    Yes Historical Provider, MD  fenofibrate (TRICOR) 145 MG tablet Take 145 mg by mouth daily.    Yes Historical Provider, MD  ferrous sulfate 325 (65 FE) MG tablet Take 325 mg by mouth daily with breakfast.    Yes Historical Provider, MD  fluticasone furoate-vilanterol (BREO ELLIPTA) 200-25 MCG/INH AEPB Inhale 1 puff into the lungs daily.    Yes Historical Provider, MD  hydrochlorothiazide (HYDRODIURIL) 25 MG tablet Take 25 mg by mouth daily.    Yes Historical Provider, MD  metoprolol tartrate (LOPRESSOR) 25 MG tablet Take 25 mg by mouth 2 (two) times daily.  01/06/16 02/05/16 Yes Historical Provider, MD  nitroGLYCERIN (NITROSTAT) 0.4 MG SL tablet Place  0.4 mg under the tongue every 5 (five) minutes as needed for chest pain.  12/08/15 12/07/16 Yes Historical Provider, MD  Omega-3 1000 MG CAPS Take 2,000 mg by mouth daily.    Yes Historical Provider, MD  oxybutynin (DITROPAN) 5 MG tablet Take 5 mg by mouth 3 (three) times daily.    Yes Historical Provider, MD  potassium chloride SA (KLOR-CON M20) 20 MEQ tablet Take 20 mEq by mouth 2 (two) times daily.  08/04/15  Yes Historical Provider, MD  pregabalin  (LYRICA) 100 MG capsule Take 100 mg by mouth 2 (two) times daily.    Yes Historical Provider, MD  risperiDONE (RISPERDAL) 3 MG tablet Take 3 mg by mouth at bedtime.    Yes Historical Provider, MD  rosuvastatin (CRESTOR) 20 MG tablet Take 20 mg by mouth at bedtime.    Yes Historical Provider, MD  ticagrelor (BRILINTA) 90 MG TABS tablet Take 90 mg by mouth 2 (two) times daily.  01/06/16  Yes Historical Provider, MD  topiramate (TOPAMAX) 50 MG tablet Take 50 mg by mouth daily.    Yes Historical Provider, MD  venlafaxine XR (EFFEXOR-XR) 150 MG 24 hr capsule Take 300 mg by mouth daily with breakfast.    Yes Historical Provider, MD  albuterol (PROVENTIL HFA;VENTOLIN HFA) 108 (90 Base) MCG/ACT inhaler Inhale 2 puffs into the lungs every 6 (six) hours as needed for wheezing or shortness of breath.     Historical Provider, MD  aspirin 81 MG chewable tablet Chew 81 mg by mouth daily.  01/06/16 01/05/17  Historical Provider, MD  oseltamivir (TAMIFLU) 75 MG capsule Take 1 capsule (75 mg total) by mouth 2 (two) times daily. 02/01/16   Leana Roe Elgergawy, MD  predniSONE (DELTASONE) 10 MG tablet Please take 40 mg oral daily  for 3 days, then 30 mg oral daily for 3 days, then 20 mg oral daily for 3 days, then 10 mg oral daily for 3 days then stop. 02/01/16   Leana Roe Elgergawy, MD   BP 106/73 mmHg  Pulse 69  Temp(Src) 97.8 F (36.6 C) (Oral)  Resp 22  Ht 5\' 2"  (1.575 m)  Wt 214 lb 11.7 oz (97.4 kg)  BMI 39.26 kg/m2  SpO2 99% Physical Exam  Constitutional: She is oriented to person, place, and time. She appears well-developed and well-nourished.  Non-toxic appearance. She does not have a sickly appearance. She appears ill. No distress.  Recheck oral temp the bedside by myself 99.8  HENT:  Head: Normocephalic.  Eyes: Conjunctivae are normal. Pupils are equal, round, and reactive to light. No scleral icterus.  Neck: Normal range of motion. Neck supple. No thyromegaly present.  Cardiovascular: Normal rate and regular  rhythm.  Exam reveals no gallop and no friction rub.   No murmur heard. Heart rate 93. Sinus rhythm on the monitor. 100% sats.  Pulmonary/Chest: Effort normal. No respiratory distress. She has decreased breath sounds in the left middle field and the left lower field. She has wheezes. She has rhonchi. She has no rales.  Diffuse scattered rhonchi. Prolongation and wheezing. Finished left basilar breath sounds.  Abdominal: Soft. Bowel sounds are normal. She exhibits no distension. There is no tenderness. There is no rebound.  Musculoskeletal: Normal range of motion.  Neurological: She is alert and oriented to person, place, and time.  Skin: Skin is warm and dry. No rash noted.  Psychiatric: She has a normal mood and affect. Her behavior is normal.    ED Course  Procedures (including critical  care time) Labs Review Labs Reviewed  CBC WITH DIFFERENTIAL/PLATELET - Abnormal; Notable for the following:    RBC 3.75 (*)    Hemoglobin 11.9 (*)    HCT 34.6 (*)    All other components within normal limits  BASIC METABOLIC PANEL - Abnormal; Notable for the following:    Sodium 133 (*)    Potassium 2.9 (*)    Chloride 98 (*)    Glucose, Bld 149 (*)    Creatinine, Ser 1.25 (*)    Calcium 8.7 (*)    GFR calc non Af Amer 49 (*)    GFR calc Af Amer 57 (*)    All other components within normal limits  INFLUENZA PANEL BY PCR (TYPE A & B, H1N1) - Abnormal; Notable for the following:    Influenza A By PCR POSITIVE (*)    All other components within normal limits  COMPREHENSIVE METABOLIC PANEL - Abnormal; Notable for the following:    Glucose, Bld 149 (*)    Calcium 8.2 (*)    Albumin 3.0 (*)    Total Bilirubin 0.2 (*)    All other components within normal limits  CBC - Abnormal; Notable for the following:    RBC 3.39 (*)    Hemoglobin 10.8 (*)    HCT 32.8 (*)    All other components within normal limits  BASIC METABOLIC PANEL - Abnormal; Notable for the following:    Glucose, Bld 165 (*)     Calcium 8.5 (*)    All other components within normal limits  I-STAT CG4 LACTIC ACID, ED - Abnormal; Notable for the following:    Lactic Acid, Venous 3.05 (*)    All other components within normal limits  CULTURE, BLOOD (ROUTINE X 2)  CULTURE, BLOOD (ROUTINE X 2)  MRSA PCR SCREENING  I-STAT CG4 LACTIC ACID, ED    Imaging Review No results found. I have personally reviewed and evaluated these images and lab results as part of my medical decision-making.   EKG Interpretation None      MDM   Final diagnoses:  Sepsis, due to unspecified organism [facility])  Influenza    Bronchodilators, IV steroids, chest x-ray, fluid hydration, lactate, basic lab reevaluation.  13:30:  Patient feeling "a litle better ". Pulse 86.  Recheck oral temp 100.8BP 106/79. Sat 95% sats. No infiltrate on chest x-ray. No leukocytosis. Lactate 3.05. Potassium low at 2.9. Given by mouth, and IV potassium. She been given IV fluids. Recheck lactate. . Was given by mouth Levaquin here. Will reassess.May be appropriate for DC.  14:45:  Pt BP 96/76.  Has had @L  NS.  Will give 3rd liter and recheck lactate.  Will discuss disposition with hospitalist.  CRITICAL CARE Performed by: JOSEPH   Total critical care time: 60 minutes  Critical care time was exclusive of separately billable procedures and treating other patients.  Critical care was necessary to treat or prevent imminent or life-threatening deterioration.  Critical care was time spent personally by me on the following activities: development of treatment plan with patient and/or surrogate as well as nursing, discussions with consultants, evaluation of patient's response to treatment, examination of patient, obtaining history from patient or surrogate, ordering and performing treatments and interventions, ordering and review of laboratory studies, ordering and review of radiographic studies, pulse oximetry and re-evaluation of patient's condition.  Care     Rolland Porter, MD 02/02/16 2258

## 2016-01-29 NOTE — ED Notes (Signed)
Do not draw lactic acid until after NS #3 Liter infused per Dr. Fayrene Fearing.

## 2016-01-29 NOTE — ED Notes (Signed)
Patient transported to X-ray 

## 2016-01-29 NOTE — ED Notes (Signed)
Lactic is given to Dr. Fayrene Fearing.

## 2016-01-29 NOTE — H&P (Signed)
[facility] History and Physical  STEVE GREGG TMH:[id number] DOB: 1966-02-29 DOA: 01/29/2016  Referring physician: Dr. Fayrene Fearing in the emergency department at [facility] long PCP: Dema Severin, NP   Chief Complaint: Shortness of breath, body aches, cough.  HPI: Lydia [name] is a 50 y.o. female with a past medical history significant for chronic obstructive pulmonary disease, patient is on 2 L of supplemental oxygen via nasal cannula at home that she uses on an as-needed basis. She has history of hypertension and history of lifelong cigarette smoking. Currently also actively smoking although has not been able to smoke for the past 7 days or so. Cardiac history is also positive. Patient has coronary artery disease. On 01-05-16, patient underwent placement of 3 stents in her coronary arteries by Dr. Chales Abrahams from [facility] cardiology. Echocardiogram from that time shows normal left ventricular function as obtained from records and care everywhere.  Patient lives in [place] with her son. Her son has had the flu and has been sick at home for the past 7-10 days. The patient has been having body aches and sweats for 1 week. She is not sleeping. She is not eating. She has had ongoing diarrhea and generalized abdominal discomfort. She is also describing subjective low-grade temperatures. She is short of breath.  Patient arrived to the emergency department with these complaints. Patient underwent aggressive IV fluid resuscitation thus far. 3 L of normal saline IV fluid have been given. Additionally, patient received nebulizers, morphine IV, IV Levaquin, IV Solu-Medrol. Laboratory workup shows lactate of 3.05 although a subsequent one has just demonstrated a lactate level of 0.69. No evidence of leukocytosis. Severe hypokalemia at potassium level II.9. Low sodium numbers 133. Imaging workup in the emergency department, chest x-ray showing bronchitis changes no evidence of overt infiltrate. At the time of my  dictation of this history and physical, influenza panel is pending.  Patient appears very weak. She is awake and alert and is able to have conversations normally. She gives most of her history and then some is supplemented by her son present at the bedside. Patient complains of nausea. She states she is not able to have vomiting due to history of Nissen's fundoplication in the past. No history of diabetes.  Hospitalist admission continues  Review of Systems:  Constitutional:  No weight loss, night sweats, Fevers, chills, fatigue.  HEENT:  Positive for sore throat Positive for nasal congestion, post nasal drip,  Cardio-vascular:  No chest pain, Orthopnea, PND, swelling in lower extremities, anasarca, dizziness, palpitations  GI:  No heartburn, indigestion, abdominal pain, nausea, vomiting, diarrhea, change in bowel habits, loss of appetite  Resp:  Positive for shortness of breath , mucus, some productive cough, No coughing up of blood.some mild wheezing at home Skin:  no rash or lesions.  GU:  no dysuria, change in color of urine, no urgency or frequency. No flank pain.  Musculoskeletal:  No joint pain or swelling. No decreased range of motion. No back pain.  Psych:  No change in mood or affect. No depression or anxiety. No memory loss.   Past Medical History  Diagnosis Date  . COPD (chronic obstructive pulmonary disease) (HCC)   . Fibromyalgia   . CHF (congestive heart failure) (HCC)   . Depression    Past Surgical History  Procedure Laterality Date  . Abdominal hysterectomy    . Knee surgery Left   . Tonsillectomy     Social History:  reports that she has been smoking Cigarettes.  She  has been smoking about 1.00 pack per day. She does not have any smokeless tobacco history on file. She reports that she does not drink alcohol or use illicit drugs. Lifelong history of cigarette smoking. [place]. Allergies  Allergen Reactions  .  Nsaids Nausea Only and Other (See Comments)    Heartburn    History reviewed. No pertinent family history. positive for family history of cancer in her father who is deceased  Prior to Admission medications   Medication Sig Start Date End Date Taking? Authorizing Provider  ALPRAZolam Prudy Feeler) 1 MG tablet Take 1 mg by mouth at bedtime as needed for sleep.    Yes Historical Provider, MD  amitriptyline (ELAVIL) 100 MG tablet Take 100 mg by mouth at bedtime.    Yes Historical Provider, MD  benztropine (COGENTIN) 0.5 MG tablet Take 0.5 mg by mouth 2 (two) times daily.    Yes Historical Provider, MD  cyclobenzaprine (FLEXERIL) 10 MG tablet Take 10 mg by mouth 3 (three) times daily as needed for muscle spasms.   Yes Historical Provider, MD  esomeprazole (NEXIUM) 40 MG capsule Take 40 mg by mouth every morning.    Yes Historical Provider, MD  fenofibrate (TRICOR) 145 MG tablet Take 145 mg by mouth daily.    Yes Historical Provider, MD  ferrous sulfate 325 (65 FE) MG tablet Take 325 mg by mouth daily with breakfast.    Yes Historical Provider, MD  fluticasone furoate-vilanterol (BREO ELLIPTA) 200-25 MCG/INH AEPB Inhale 1 puff into the lungs daily.    Yes Historical Provider, MD  hydrochlorothiazide (HYDRODIURIL) 25 MG tablet Take 25 mg by mouth daily.    Yes Historical Provider, MD  metoprolol tartrate (LOPRESSOR) 25 MG tablet Take 25 mg by mouth 2 (two) times daily.  01/06/16 02/05/16 Yes Historical Provider, MD  nitroGLYCERIN (NITROSTAT) 0.4 MG SL tablet Place 0.4 mg under the tongue every 5 (five) minutes as needed for chest pain.  12/08/15 12/07/16 Yes Historical Provider, MD  Omega-3 1000 MG CAPS Take 2,000 mg by mouth daily.    Yes Historical Provider, MD  oxybutynin (DITROPAN) 5 MG tablet Take 5 mg by mouth 3 (three) times daily.    Yes Historical Provider, MD  potassium chloride SA (KLOR-CON M20) 20 MEQ tablet Take 20 mEq by mouth 2 (two) times daily.  08/04/15  Yes Historical Provider, MD  pregabalin  (LYRICA) 100 MG capsule Take 100 mg by mouth 2 (two) times daily.    Yes Historical Provider, MD  risperiDONE (RISPERDAL) 3 MG tablet Take 3 mg by mouth at bedtime.    Yes Historical Provider, MD  rosuvastatin (CRESTOR) 20 MG tablet Take 20 mg by mouth at bedtime.    Yes Historical Provider, MD  ticagrelor (BRILINTA) 90 MG TABS tablet Take 90 mg by mouth 2 (two) times daily.  01/06/16  Yes Historical Provider, MD  topiramate (TOPAMAX) 50 MG tablet Take 50 mg by mouth daily.    Yes Historical Provider, MD  venlafaxine XR (EFFEXOR-XR) 150 MG 24 hr capsule Take 300 mg by mouth daily with breakfast.    Yes Historical Provider, MD  albuterol (PROVENTIL HFA;VENTOLIN HFA) 108 (90 Base) MCG/ACT inhaler Inhale 2 puffs into the lungs every 6 (six) hours as needed for wheezing or shortness of breath.     Historical Provider, MD  aspirin 81 MG chewable tablet Chew 81 mg by mouth daily.  01/06/16 01/05/17  Historical Provider, MD   Physical Exam: Filed Vitals:  01/29/16 1500 01/29/16 1501 01/29/16 1515 01/29/16 1530  BP: 81/44 92/54 96/61  84/59  Pulse: 79 82 83 84  Temp:      TempSrc:      Resp: 20 20 18 23   Height:      Weight:      SpO2: 100% 100% 96% 95%    Wt Readings from Last 3 Encounters:  01/29/16 99.338 kg (219 lb)    General:  Appears weak Eyes: PERRL, normal lids, irises & conjunctiva ENT: Dry lips & tongue Neck: no LAD, masses or thyromegaly Cardiovascular: RRR, no m/r/g. No LE edema. Telemetry: SR, no arrhythmias  Respiratory: Diminished bilaterally faint scattered expiratory wheezes Abdomen: soft, ntnd Skin: no rash or induration seen on limited exam Musculoskeletal: grossly normal tone BUE/BLE Psychiatric: grossly normal mood and affect, speech fluent and appropriate Neurologic: grossly non-focal.          Labs on Admission:  Basic Metabolic Panel:  Recent Labs Lab 01/29/16 1230  NA 133*  K 2.9*  CL 98*  CO2 22  GLUCOSE 149*  BUN 10  CREATININE 1.25*  CALCIUM 8.7*     Liver Function Tests: No results for input(s): AST, ALT, ALKPHOS, BILITOT, PROT, ALBUMIN in the last 168 hours. No results for input(s): LIPASE, AMYLASE in the last 168 hours. No results for input(s): AMMONIA in the last 168 hours. CBC:  Recent Labs Lab 01/29/16 1230  WBC 8.1  NEUTROABS 5.9  HGB 11.9*  HCT 34.6*  MCV 92.3  PLT 248   Cardiac Enzymes: No results for input(s): CKTOTAL, CKMB, CKMBINDEX, TROPONINI in the last 168 hours.  BNP (last 3 results) No results for input(s): BNP in the last 8760 hours.  ProBNP (last 3 results) No results for input(s): PROBNP in the last 8760 hours.  CBG: No results for input(s): GLUCAP in the last 168 hours.  Radiological Exams on Admission: Dg Chest 2 View  01/29/2016  CLINICAL DATA:  Fever, cough, congestion, shortness of breath, weakness, diarrhea and dizziness for 2 weeks, worsened symptoms today, history COPD, CHF, smoking, fibromyalgia EXAM: CHEST  2 VIEW COMPARISON:  12/30/2015 FINDINGS: Mild enlargement of cardiac silhouette. Coronary arterial stent versus calcification noted. Mediastinal contours and pulmonary vascularity normal. Chronic bronchitic changes. No acute infiltrate, pleural effusion or pneumothorax. Bones demineralized. IMPRESSION: Enlargement of cardiac silhouette with question coronary arterial stent versus coronary calcification. Bronchitic changes without infiltrate Electronically Signed   By: 03/30/2016 M.D.   On: 01/29/2016 13:18    EKG: Independently reviewed.   Assessment/Plan Active Problems:   Sepsis (HCC)   Respiratory failure (HCC)  1. Possible acute viral influenza: Sick contact at home in her son. Although patient is outside of the 48-hour window, will start Tamiflu at this time to minimize influenza complications. Patient already with history of COPD, smoking.  2. Acute bronchitis, Mild COPD exacerbation: IV Rocephin, azithromycin, IV steroids, nebulized breathing treatments, Mucinex. Continue  supplemental oxygen. Respiratory therapy consultation for titrating oxygen, providing breathing treatments.  3. Sepsis secondary to #1, 2: IV fluid resuscitation, as above. Monitor blood pressures. Questionable need for vasopressors if remains hypotensive. Continue to follow. Follow blood work in a.m. Questionable erroneous serum lactate level. Follow-up blood cultures obtained in the emergency department. Follow-up results of NP swab for influenza.  4. Hypokalemia replace IV. 5. Diarrhea, nausea: Likely reflective of viral etiology. Continue to monitor. Continue IV fluids. When necessary Zofran. 6. Clear liquid diet for now Monitor for signs of volume overload, continue IV fluid resuscitation for now.  Consultants: None ordered at the time of admission. Monitor patient's disease trajectory. Consider pulmonary consultation if patient does not demonstrate clinical improvement from a respiratory standpoint.  Admit to stepdown bed. Code Status: Full code DVT Prophylaxis: Lovenox, SCDs Family Communication:  Discussed with son at bedside Disposition Plan: 2-3 days   Time spent: 90 min   Rosalin Hawking [facility] Pager [phone]

## 2016-01-30 DIAGNOSIS — J441 Chronic obstructive pulmonary disease with (acute) exacerbation: Secondary | ICD-10-CM

## 2016-01-30 DIAGNOSIS — J111 Influenza due to unidentified influenza virus with other respiratory manifestations: Secondary | ICD-10-CM

## 2016-01-30 DIAGNOSIS — J9610 Chronic respiratory failure, unspecified whether with hypoxia or hypercapnia: Secondary | ICD-10-CM

## 2016-01-30 DIAGNOSIS — A419 Sepsis, unspecified organism: Principal | ICD-10-CM

## 2016-01-30 LAB — COMPREHENSIVE METABOLIC PANEL
ALBUMIN: 3 g/dL — AB (ref 3.5–5.0)
ALT: 15 U/L (ref 14–54)
AST: 19 U/L (ref 15–41)
Alkaline Phosphatase: 47 U/L (ref 38–126)
Anion gap: 9 (ref 5–15)
BUN: 9 mg/dL (ref 6–20)
CHLORIDE: 109 mmol/L (ref 101–111)
CO2: 23 mmol/L (ref 22–32)
Calcium: 8.2 mg/dL — ABNORMAL LOW (ref 8.9–10.3)
Creatinine, Ser: 0.76 mg/dL (ref 0.44–1.00)
GFR calc Af Amer: >60 mL/min (ref >60)
GFR calc non Af Amer: >60 mL/min (ref >60)
GLUCOSE: 149 mg/dL — AB (ref 65–99)
POTASSIUM: 4 mmol/L (ref 3.5–5.1)
SODIUM: 141 mmol/L (ref 135–145)
Total Bilirubin: 0.2 mg/dL — ABNORMAL LOW (ref 0.3–1.2)
Total Protein: 6.6 g/dL (ref 6.5–8.1)

## 2016-01-30 MED ORDER — CETYLPYRIDINIUM CHLORIDE 0.05 % MT LIQD
7.0000 mL | Freq: Two times a day (BID) | OROMUCOSAL | Status: DC
  Administered 2016-01-31 – 2016-02-01 (×3): 7 mL via OROMUCOSAL

## 2016-01-30 MED ORDER — NYSTATIN 100000 UNIT/GM EX POWD
Freq: Two times a day (BID) | CUTANEOUS | Status: DC
  Administered 2016-01-30 – 2016-01-31 (×4): via TOPICAL
  Filled 2016-01-30: qty 15

## 2016-01-30 MED ORDER — FLUCONAZOLE 100 MG PO TABS
100.0000 mg | ORAL_TABLET | Freq: Every day | ORAL | Status: DC
  Administered 2016-01-30 – 2016-02-01 (×3): 100 mg via ORAL
  Filled 2016-01-30 (×3): qty 1

## 2016-01-30 NOTE — Progress Notes (Signed)
Report received from emily reynolds rn and agree with assesment.

## 2016-01-30 NOTE — Progress Notes (Addendum)
Patient Demographics  Lydia [name], is a 50 y.o. female, DOB - [birth year], FMB:[id number]  Admit date - 01/29/2016   Admitting Physician Rosalin Hawking, MD  Outpatient Primary MD for the patient is Dema Severin, NP  LOS - 1   Chief Complaint  Patient presents with  . Flu Like Symptoms         Subjective:   Lydia [name] today has, No headache, No chest pain, No abdominal pain - She is feeling better, still with cough, productive with white sputum . Assessment & Plan    Active Problems:   Sepsis (HCC)   Respiratory failure (HCC)   Influenza   Chronic obstructive pulmonary disease with acute exacerbation (HCC)  Sepsis  - Patient presents with hypotension, tachycardia, and elevated lactic acid, meet sepsis criteria on admission, this is most likely due to acute bronchitis and influenza   Influenza  - Continue Tamiflu   Acute bronchitis  - Continue with Rocephin and azithromycin   COPD exacerbation  - Continue with IV steroids, nebs, continue with antibiotic given productive sputum   Chronic respiratory failure  - Continue to COPD, on 2 L at baseline   Tobacco abuse - Counseled  Code Status: Full  Family Communication: none at bedside  Disposition Plan: home when stable   Procedures  none   Consults   none   Medications  Scheduled Meds: . antiseptic oral rinse  7 mL Mouth Rinse BID  . [START ON 01/31/2016] azithromycin  250 mg Oral Daily  . cefTRIAXone (ROCEPHIN)  IV  1 g Intravenous Q24H  . enoxaparin (LOVENOX) injection  0.5 mg/kg Subcutaneous Q24H  . guaiFENesin  600 mg Oral BID  . methylPREDNISolone (SOLU-MEDROL) injection  60 mg Intravenous Q12H  . nicotine  21 mg Transdermal Daily  . oseltamivir  75 mg Oral BID  . sodium chloride flush  3 mL Intravenous Q12H   Continuous Infusions: . sodium chloride 125 mL/hr at 01/30/16 0121   PRN Meds:.acetaminophen,  albuterol, morphine injection, ondansetron (ZOFRAN) IV  DVT Prophylaxis  Lovenox   Lab Results  Component Value Date   PLT 248 01/29/2016    Antibiotics    Anti-infectives    Start     Dose/Rate Route Frequency Ordered Stop   01/31/16 1000  azithromycin (ZITHROMAX) tablet 250 mg     250 mg Oral Daily 01/29/16 1627 02/04/16 0959   01/30/16 1000  azithromycin (ZITHROMAX) tablet 250 mg  Status:  Discontinued     250 mg Oral Daily 01/29/16 1538 01/29/16 1626   01/30/16 1000  azithromycin (ZITHROMAX) tablet 500 mg     500 mg Oral Daily 01/29/16 1627 01/30/16 0938   01/29/16 1600  oseltamivir (TAMIFLU) capsule 75 mg     75 mg Oral 2 times daily 01/29/16 1539 02/03/16 2159   01/29/16 1545  cefTRIAXone (ROCEPHIN) 1 g in dextrose 5 % 50 mL IVPB     1 g 100 mL/hr over 30 Minutes Intravenous Every 24 hours 01/29/16 1538     01/29/16 1545  azithromycin (ZITHROMAX) tablet 500 mg  Status:  Discontinued     500 mg Oral Daily 01/29/16 1538 01/29/16 1626   01/29/16 1400  levofloxacin (LEVAQUIN) tablet 750 mg     750 mg  Oral  Once 01/29/16 1351 01/29/16 1357          Objective:   Filed Vitals:   01/30/16 0600 01/30/16 0700 01/30/16 0800 01/30/16 0900  BP: 106/65 111/65 104/66 92/51  Pulse: 67 79 79 86  Temp:   97.7 F (36.5 C)   TempSrc:   Oral   Resp: 20 19 17 21   Height:      Weight:      SpO2: 94% 94% 94% 97%    Wt Readings from Last 3 Encounters:  01/29/16 97.4 kg (214 lb 11.7 oz)     Intake/Output Summary (Last 24 hours) at 01/30/16 1032 Last data filed at 01/30/16 0900  Gross per 24 hour  Intake   3675 ml  Output   1550 ml  Net   2125 ml     Physical Exam  Awake Alert, Oriented X 3, No new F.N deficits, Normal affect [place].AT,PERRAL Supple Neck,No JVD, No cervical lymphadenopathy appriciated.  Symmetrical Chest wall movement, Good air movement bilaterally, Scattered wheezing bilaterally RRR,No Gallops,Rubs or new Murmurs, No Parasternal Heave +ve B.Sounds, Abd  Soft, No tenderness, No organomegaly appriciated, No rebound - guarding or rigidity. No Cyanosis, Clubbing or edema, No new Rash or bruise     Data Review   Micro Results Recent Results (from the past 240 hour(s))  MRSA PCR Screening     Status: None   Collection Time: 01/29/16  4:30 PM  Result Value Ref Range Status   MRSA by PCR NEGATIVE NEGATIVE Final    Comment:        The GeneXpert MRSA Assay (FDA approved for NASAL specimens only), is one component of a comprehensive MRSA colonization surveillance program. It is not intended to diagnose MRSA infection nor to guide or monitor treatment for MRSA infections.     Radiology Reports Dg Chest 2 View  01/29/2016  CLINICAL DATA:  Fever, cough, congestion, shortness of breath, weakness, diarrhea and dizziness for 2 weeks, worsened symptoms today, history COPD, CHF, smoking, fibromyalgia EXAM: CHEST  2 VIEW COMPARISON:  12/30/2015 FINDINGS: Mild enlargement of cardiac silhouette. Coronary arterial stent versus calcification noted. Mediastinal contours and pulmonary vascularity normal. Chronic bronchitic changes. No acute infiltrate, pleural effusion or pneumothorax. Bones demineralized. IMPRESSION: Enlargement of cardiac silhouette with question coronary arterial stent versus coronary calcification. Bronchitic changes without infiltrate Electronically Signed   By: 02/29/2016 M.D.   On: 01/29/2016 13:18     CBC  Recent Labs Lab 01/29/16 1230  WBC 8.1  HGB 11.9*  HCT 34.6*  PLT 248  MCV 92.3  MCH 31.7  MCHC 34.4  RDW 12.9  LYMPHSABS 1.3  MONOABS 0.9  EOSABS 0.0  BASOSABS 0.0    Chemistries   Recent Labs Lab 01/29/16 1230 01/30/16 0327  NA 133* 141  K 2.9* 4.0  CL 98* 109  CO2 22 23  GLUCOSE 149* 149*  BUN 10 9  CREATININE 1.25* 0.76  CALCIUM 8.7* 8.2*  AST  --  19  ALT  --  15  ALKPHOS  --  47  BILITOT  --  0.2*    ------------------------------------------------------------------------------------------------------------------ estimated creatinine clearance is 90.6 mL/min (by C-G formula based on Cr of 0.76). ------------------------------------------------------------------------------------------------------------------ No results for input(s): HGBA1C in the last 72 hours. ------------------------------------------------------------------------------------------------------------------ No results for input(s): CHOL, HDL, LDLCALC, TRIG, CHOLHDL, LDLDIRECT in the last 72 hours. ------------------------------------------------------------------------------------------------------------------ No results for input(s): TSH, T4TOTAL, T3FREE, THYROIDAB in the last 72 hours.  Invalid input(s): FREET3 ------------------------------------------------------------------------------------------------------------------ No results for input(s): VITAMINB12,  FOLATE, FERRITIN, TIBC, IRON, RETICCTPCT in the last 72 hours.  Coagulation profile No results for input(s): INR, PROTIME in the last 168 hours.  No results for input(s): DDIMER in the last 72 hours.  Cardiac Enzymes No results for input(s): CKMB, TROPONINI, MYOGLOBIN in the last 168 hours.  Invalid input(s): CK ------------------------------------------------------------------------------------------------------------------ Invalid input(s): POCBNP     Time Spent in minutes   30 minutes   Philana Younis M.D on 01/30/2016 at 10:32 AM  Between 7am to 7pm - Pager - [phone]  After 7pm go to [email or web address] - password [facility]  [facility]   Office  [phone]

## 2016-01-31 DIAGNOSIS — J9611 Chronic respiratory failure with hypoxia: Secondary | ICD-10-CM

## 2016-01-31 DIAGNOSIS — J209 Acute bronchitis, unspecified: Secondary | ICD-10-CM

## 2016-01-31 LAB — BASIC METABOLIC PANEL
Anion gap: 6 (ref 5–15)
BUN: 11 mg/dL (ref 6–20)
CHLORIDE: 107 mmol/L (ref 101–111)
CO2: 26 mmol/L (ref 22–32)
CREATININE: 0.9 mg/dL (ref 0.44–1.00)
Calcium: 8.5 mg/dL — ABNORMAL LOW (ref 8.9–10.3)
GFR calc non Af Amer: >60 mL/min (ref >60)
GLUCOSE: 165 mg/dL — AB (ref 65–99)
Potassium: 4.4 mmol/L (ref 3.5–5.1)
Sodium: 139 mmol/L (ref 135–145)

## 2016-01-31 LAB — CBC
HEMATOCRIT: 32.8 % — AB (ref 36.0–46.0)
HEMOGLOBIN: 10.8 g/dL — AB (ref 12.0–15.0)
MCH: 31.9 pg (ref 26.0–34.0)
MCHC: 32.9 g/dL (ref 30.0–36.0)
MCV: 96.8 fL (ref 78.0–100.0)
Platelets: 260 10*3/uL (ref 150–400)
RBC: 3.39 MIL/uL — ABNORMAL LOW (ref 3.87–5.11)
RDW: 13.4 % (ref 11.5–15.5)
WBC: 9 10*3/uL (ref 4.0–10.5)

## 2016-01-31 MED ORDER — METHYLPREDNISOLONE SODIUM SUCC 40 MG IJ SOLR
40.0000 mg | Freq: Two times a day (BID) | INTRAMUSCULAR | Status: AC
  Administered 2016-01-31 (×2): 40 mg via INTRAVENOUS
  Filled 2016-01-31 (×2): qty 1

## 2016-01-31 MED ORDER — PREDNISONE 5 MG PO TABS
30.0000 mg | ORAL_TABLET | Freq: Every day | ORAL | Status: DC
  Administered 2016-02-01: 30 mg via ORAL
  Filled 2016-01-31: qty 2

## 2016-01-31 MED ORDER — ALPRAZOLAM 1 MG PO TABS
1.0000 mg | ORAL_TABLET | Freq: Once | ORAL | Status: AC
  Administered 2016-01-31: 1 mg via ORAL
  Filled 2016-01-31: qty 1

## 2016-01-31 NOTE — Progress Notes (Signed)
Patient Demographics  Lydia [name], is a 50 y.o. female, DOB - Aug 15, 1966, YOV:[id number]  Admit date - 01/29/2016   Admitting Physician Rosalin Hawking, MD  Outpatient Primary MD for the patient is Dema Severin, NP  LOS - 2   Chief Complaint  Patient presents with  . Flu Like Symptoms         Subjective:   [name] today has, No headache, No chest pain, No abdominal pain - She is feeling better, still with cough, currently nonproductive. Assessment & Plan    Active Problems:   Sepsis (HCC)   Respiratory failure (HCC)   Influenza   Chronic obstructive pulmonary disease with acute exacerbation (HCC)  Sepsis  - Patient presents with hypotension, tachycardia, and elevated lactic acid, meet sepsis criteria on admission, this is most likely due to acute bronchitis and influenza   Influenza  - Continue Tamiflu   Acute bronchitis  - Continue with Rocephin and azithromycin   COPD exacerbation  - continue with antibiotic given productive sputum , wheezing significantly improved today, will taper IV Solu-Medrol, hopefully can be changed in a.m. 2 by mouth  Chronic respiratory failure  - Continue to COPD, on 2 L at baseline   Tobacco abuse - Counseled  Code Status: Full  Family Communication: none at bedside  Disposition Plan: home in 24 in continues to improve   Procedures  none   Consults   none   Medications  Scheduled Meds: . antiseptic oral rinse  7 mL Mouth Rinse BID  . azithromycin  250 mg Oral Daily  . cefTRIAXone (ROCEPHIN)  IV  1 g Intravenous Q24H  . enoxaparin (LOVENOX) injection  0.5 mg/kg Subcutaneous Q24H  . fluconazole  100 mg Oral Daily  . guaiFENesin  600 mg Oral BID  . methylPREDNISolone (SOLU-MEDROL) injection  40 mg Intravenous Q12H  . nicotine  21 mg Transdermal Daily  . nystatin   Topical BID  . oseltamivir  75 mg Oral BID  . sodium chloride flush   3 mL Intravenous Q12H   Continuous Infusions: . sodium chloride 50 mL/hr at 01/31/16 0135   PRN Meds:.acetaminophen, albuterol, morphine injection, ondansetron (ZOFRAN) IV  DVT Prophylaxis  Lovenox   Lab Results  Component Value Date   PLT 260 01/31/2016    Antibiotics    Anti-infectives    Start     Dose/Rate Route Frequency Ordered Stop   01/31/16 1000  azithromycin (ZITHROMAX) tablet 250 mg     250 mg Oral Daily 01/29/16 1627 02/04/16 0959   01/30/16 1230  fluconazole (DIFLUCAN) tablet 100 mg     100 mg Oral Daily 01/30/16 1154     01/30/16 1000  azithromycin (ZITHROMAX) tablet 250 mg  Status:  Discontinued     250 mg Oral Daily 01/29/16 1538 01/29/16 1626   01/30/16 1000  azithromycin (ZITHROMAX) tablet 500 mg     500 mg Oral Daily 01/29/16 1627 01/30/16 0938   01/29/16 1600  oseltamivir (TAMIFLU) capsule 75 mg     75 mg Oral 2 times daily 01/29/16 1539 02/03/16 2159   01/29/16 1545  cefTRIAXone (ROCEPHIN) 1 g in dextrose 5 % 50 mL IVPB     1 g 100 mL/hr over 30 Minutes Intravenous Every 24  hours 01/29/16 1538     01/29/16 1545  azithromycin (ZITHROMAX) tablet 500 mg  Status:  Discontinued     500 mg Oral Daily 01/29/16 1538 01/29/16 1626   01/29/16 1400  levofloxacin (LEVAQUIN) tablet 750 mg     750 mg Oral  Once 01/29/16 1351 01/29/16 1357          Objective:   Filed Vitals:   01/30/16 1347 01/30/16 2301 01/31/16 0012 01/31/16 0506  BP: 107/79 112/59  130/67  Pulse: 85 79 78 58  Temp: 98.2 F (36.8 C) 97.9 F (36.6 C)  97.7 F (36.5 C)  TempSrc: Oral Oral  Oral  Resp: 22 20 22 20   Height:      Weight:      SpO2: 99% 99% 98% 97%    Wt Readings from Last 3 Encounters:  01/29/16 97.4 kg (214 lb 11.7 oz)     Intake/Output Summary (Last 24 hours) at 01/31/16 1123 Last data filed at 01/30/16 2300  Gross per 24 hour  Intake   1070 ml  Output      0 ml  Net   1070 ml     Physical Exam  Awake Alert, Oriented X 3, No new F.N deficits, Normal  affect [place].AT,PERRAL Supple Neck,No JVD, No cervical lymphadenopathy appriciated.  Symmetrical Chest wall movement, Good air movement bilaterally, no wheezing RRR,No Gallops,Rubs or new Murmurs, No Parasternal Heave +ve B.Sounds, Abd Soft, No tenderness, No organomegaly appriciated, No rebound - guarding or rigidity. No Cyanosis, Clubbing or edema, No new Rash or bruise     Data Review   Micro Results Recent Results (from the past 240 hour(s))  Culture, blood (Routine X 2) w Reflex to ID Panel     Status: None (Preliminary result)   Collection Time: 01/29/16  3:00 PM  Result Value Ref Range Status   Specimen Description BLOOD LEFT ARM  5 ML IN [facility] BOTTLE  Final   Special Requests NONE  Final   Culture   Final    NO GROWTH < 24 HOURS Performed at [facility]    Report Status PENDING  Incomplete  Culture, blood (Routine X 2) w Reflex to ID Panel     Status: None (Preliminary result)   Collection Time: 01/29/16  3:05 PM  Result Value Ref Range Status   Specimen Description BLOOD RIGHT ARM  5 ML IN [facility] BOTTLE  Final   Special Requests NONE  Final   Culture   Final    NO GROWTH < 24 HOURS Performed at [facility]    Report Status PENDING  Incomplete  MRSA PCR Screening     Status: None   Collection Time: 01/29/16  4:30 PM  Result Value Ref Range Status   MRSA by PCR NEGATIVE NEGATIVE Final    Comment:        The GeneXpert MRSA Assay (FDA approved for NASAL specimens only), is one component of a comprehensive MRSA colonization surveillance program. It is not intended to diagnose MRSA infection nor to guide or monitor treatment for MRSA infections.     Radiology Reports Dg Chest 2 View  01/29/2016  CLINICAL DATA:  Fever, cough, congestion, shortness of breath, weakness, diarrhea and dizziness for 2 weeks, worsened symptoms today, history COPD, CHF, smoking, fibromyalgia EXAM: CHEST  2 VIEW COMPARISON:  12/30/2015 FINDINGS: Mild enlargement of cardiac  silhouette. Coronary arterial stent versus calcification noted. Mediastinal contours and pulmonary vascularity normal. Chronic bronchitic changes. No acute infiltrate, pleural effusion  or pneumothorax. Bones demineralized. IMPRESSION: Enlargement of cardiac silhouette with question coronary arterial stent versus coronary calcification. Bronchitic changes without infiltrate Electronically Signed   By: Ulyses Southward M.D.   On: 01/29/2016 13:18     CBC  Recent Labs Lab 01/29/16 1230 01/31/16 0450  WBC 8.1 9.0  HGB 11.9* 10.8*  HCT 34.6* 32.8*  PLT 248 260  MCV 92.3 96.8  MCH 31.7 31.9  MCHC 34.4 32.9  RDW 12.9 13.4  LYMPHSABS 1.3  --   MONOABS 0.9  --   EOSABS 0.0  --   BASOSABS 0.0  --     Chemistries   Recent Labs Lab 01/29/16 1230 01/30/16 0327 01/31/16 0450  NA 133* 141 139  K 2.9* 4.0 4.4  CL 98* 109 107  CO2 22 23 26   GLUCOSE 149* 149* 165*  BUN 10 9 11   CREATININE 1.25* 0.76 0.90  CALCIUM 8.7* 8.2* 8.5*  AST  --  19  --   ALT  --  15  --   ALKPHOS  --  47  --   BILITOT  --  0.2*  --    ------------------------------------------------------------------------------------------------------------------ estimated creatinine clearance is 80.6 mL/min (by C-G formula based on Cr of 0.9). ------------------------------------------------------------------------------------------------------------------ No results for input(s): HGBA1C in the last 72 hours. ------------------------------------------------------------------------------------------------------------------ No results for input(s): CHOL, HDL, LDLCALC, TRIG, CHOLHDL, LDLDIRECT in the last 72 hours. ------------------------------------------------------------------------------------------------------------------ No results for input(s): TSH, T4TOTAL, T3FREE, THYROIDAB in the last 72 hours.  Invalid input(s):  FREET3 ------------------------------------------------------------------------------------------------------------------ No results for input(s): VITAMINB12, FOLATE, FERRITIN, TIBC, IRON, RETICCTPCT in the last 72 hours.  Coagulation profile No results for input(s): INR, PROTIME in the last 168 hours.  No results for input(s): DDIMER in the last 72 hours.  Cardiac Enzymes No results for input(s): CKMB, TROPONINI, MYOGLOBIN in the last 168 hours.  Invalid input(s): CK ------------------------------------------------------------------------------------------------------------------ Invalid input(s): POCBNP     Time Spent in minutes   20 minutes   Benna Arno M.D on 01/31/2016 at 11:23 AM  Between 7am to 7pm - Pager - [phone]  After 7pm go to [email or web address] - password [facility]  [facility]   Office  [phone]

## 2016-01-31 NOTE — Care Management Important Message (Signed)
Important Message  Patient Details  Name: Lydia [name] MRN: [id number] Date of Birth: November 13, 1965   Medicare Important Message Given:  Yes    McGibboneyFelicity Coyer, RN 01/31/2016, 2:34 PM

## 2016-02-01 DIAGNOSIS — I2510 Atherosclerotic heart disease of native coronary artery without angina pectoris: Secondary | ICD-10-CM

## 2016-02-01 MED ORDER — OSELTAMIVIR PHOSPHATE 75 MG PO CAPS: 75.0000 mg | ORAL_CAPSULE | Freq: Two times a day (BID) | ORAL | Status: DC

## 2016-02-01 MED ORDER — ASPIRIN 81 MG PO CHEW
81.0000 mg | CHEWABLE_TABLET | Freq: Every day | ORAL | Status: DC
  Administered 2016-02-01: 81 mg via ORAL
  Filled 2016-02-01: qty 1

## 2016-02-01 MED ORDER — TICAGRELOR 90 MG PO TABS
90.0000 mg | ORAL_TABLET | Freq: Two times a day (BID) | ORAL | Status: DC
  Administered 2016-02-01: 90 mg via ORAL
  Filled 2016-02-01 (×2): qty 1

## 2016-02-01 MED ORDER — PREDNISONE 10 MG PO TABS: ORAL_TABLET | ORAL | Status: DC

## 2016-02-01 MED ORDER — METOPROLOL TARTRATE 25 MG PO TABS
25.0000 mg | ORAL_TABLET | Freq: Two times a day (BID) | ORAL | Status: DC
  Administered 2016-02-01: 25 mg via ORAL
  Filled 2016-02-01: qty 1

## 2016-02-01 NOTE — Discharge Instructions (Signed)
Follow with Primary MD YORK,REGINA F, NP in 7 days  ° °Get CBC, CMP, 2 view Chest X ray checked  by Primary MD next visit.  ° ° °Activity: As tolerated with Full fall precautions use walker/cane & assistance as needed ° ° °Disposition Home ° ° °Diet: Heart Healthy , with feeding assistance and aspiration precautions. ° °For Heart failure patients - Check your Weight same time everyday, if you gain over 2 pounds, or you develop in leg swelling, experience more shortness of breath or chest pain, call your Primary MD immediately. Follow Cardiac Low Salt Diet and 1.5 lit/day fluid restriction. ° ° °On your next visit with your primary care physician please Get Medicines reviewed and adjusted. ° ° °Please request your Prim.MD to go over all Hospital Tests and Procedure/Radiological results at the follow up, please get all Hospital records sent to your Prim MD by signing hospital release before you go home. ° ° °If you experience worsening of your admission symptoms, develop shortness of breath, life threatening emergency, suicidal or homicidal thoughts you must seek medical attention immediately by calling 911 or calling your MD immediately  if symptoms less severe. ° °You Must read complete instructions/literature along with all the possible adverse reactions/side effects for all the Medicines you take and that have been prescribed to you. Take any new Medicines after you have completely understood and accpet all the possible adverse reactions/side effects.  ° °Do not drive, operating heavy machinery, perform activities at heights, swimming or participation in water activities or provide baby sitting services if your were admitted for syncope or siezures until you have seen by Primary MD or a Neurologist and advised to do so again. ° °Do not drive when taking Pain medications.  ° ° °Do not take more than prescribed Pain, Sleep and Anxiety Medications ° °Special Instructions: If you have smoked or chewed Tobacco  in the  last 2 yrs please stop smoking, stop any regular Alcohol  and or any Recreational drug use. ° °Wear Seat belts while driving. ° ° °Please note ° °You were cared for by a hospitalist during your hospital stay. If you have any questions about your discharge medications or the care you received while you were in the hospital after you are discharged, you can call the unit and asked to speak with the hospitalist on call if the hospitalist that took care of you is not available. Once you are discharged, your primary care physician will handle any further medical issues. Please note that NO REFILLS for any discharge medications will be authorized once you are discharged, as it is imperative that you return to your primary care physician (or establish a relationship with a primary care physician if you do not have one) for your aftercare needs so that they can reassess your need for medications and monitor your lab values. ° °

## 2016-02-01 NOTE — Discharge Summary (Addendum)
Lydia [name], is a 50 y.o. female  DOB 06/21/1966  MRN [id number].  Admission date:  01/29/2016  Admitting Physician  Rosalin Hawking, MD  Discharge Date:  02/01/2016   Primary MD  Dema Severin, NP  Recommendations for primary care physician for things to follow:  - check CBC, BMP during next visit   Admission Diagnosis  Influenza [J11.1] Sepsis, due to unspecified organism [facility]) [A41.9]   Discharge Diagnosis  Influenza [J11.1] Sepsis, due to unspecified organism [facility]) [A41.9]   Active Problems:   Sepsis (HCC)   Respiratory failure (HCC)   Influenza   Chronic obstructive pulmonary disease with acute exacerbation (HCC)   CAD (coronary artery disease)      Past Medical History  Diagnosis Date  . COPD (chronic obstructive pulmonary disease) (HCC)   . Fibromyalgia   . CHF (congestive heart failure) (HCC)   . Depression     Past Surgical History  Procedure Laterality Date  . Abdominal hysterectomy    . Knee surgery Left   . Tonsillectomy         History of present illness and  Hospital Course:     Kindly see H&P for history of present illness and admission details, please review complete Labs, Consult reports and Test reports for all details in brief  HPI  from the history and physical done on the day of admission 01/29/2016 HPI: Lydia [name] is a 50 y.o. female with a past medical history significant for chronic obstructive pulmonary disease, patient is on 2 L of supplemental oxygen via nasal cannula at home that she uses on an as-needed basis. She has history of hypertension and history of lifelong cigarette smoking. Currently also actively smoking although has not been able to smoke for the past 7 days or so. Cardiac history is also positive. Patient has coronary artery disease. On 01-05-16, patient underwent placement of 3 stents in her coronary arteries by Dr. Chales Abrahams from [facility]  cardiology. Echocardiogram from that time shows normal left ventricular function as obtained from records and care everywhere.  Patient lives in [place] with her son. Her son has had the flu and has been sick at home for the past 7-10 days. The patient has been having body aches and sweats for 1 week. She is not sleeping. She is not eating. She has had ongoing diarrhea and generalized abdominal discomfort. She is also describing subjective low-grade temperatures. She is short of breath.  Patient arrived to the emergency department with these complaints. Patient underwent aggressive IV fluid resuscitation thus far. 3 L of normal saline IV fluid have been given. Additionally, patient received nebulizers, morphine IV, IV Levaquin, IV Solu-Medrol. Laboratory workup shows lactate of 3.05 although a subsequent one has just demonstrated a lactate level of 0.69. No evidence of leukocytosis. Severe hypokalemia at potassium level II.9. Low sodium numbers 133. Imaging workup in the emergency department, chest x-ray showing bronchitis changes no evidence of overt infiltrate. At the time of my dictation of this history and physical, influenza panel  is pending.  Patient appears very weak. She is awake and alert and is able to have conversations normally. She gives most of her history and then some is supplemented by her son present at the bedside. Patient complains of nausea. She states she is not able to have vomiting due to history of Nissen's fundoplication in the past. No history of diabetes.   Hospital Course   Sepsis  - Patient presents with hypotension, tachycardia, and elevated lactic acid, meet sepsis criteria on admission, this is most likely due to acute bronchitis and influenza   Influenza  - Continue Tamiflu, to finish another 2 days as an outpatient.  Acute bronchitis  -Take with Rocephin and azithromycin during hospital stay, no need for further antibiotic on discharge.  COPD exacerbation  -  Continue with home medication, discharged on prednisone taper, was on IV steroids during hospital stay.  Chronic respiratory failure  - Continue to COPD, on 2 L at baseline   Tobacco abuse - Counseled  History of coronary artery disease - Status post recent stent at [facility], continue with aspirin and Plavix, as well beta blockers.  Hypertension - Blood pressure acceptable to lower side during hospital stay, discontinue hydrochlorothiazide on discharge, continue with metoprolol  Hyperlipidemia - Continue with home medication     Discharge Condition:  Stable   Follow UP  Follow-up Information    Follow up with Dema Severin, NP. Schedule an appointment as soon as possible for a visit in 1 week.   Contact information:   [address] [place] [address] [phone]         Discharge Instructions  and  Discharge Medications         Discharge Instructions    Discharge instructions    Complete by:  As directed   Follow with Primary MD Dema Severin, NP in 7 days   Get CBC, CMP, 2 view Chest X ray checked  by Primary MD next visit.    Activity: As tolerated with Full fall precautions use walker/cane & assistance as needed   Disposition Home **   Diet: Heart Healthy ** , with feeding assistance and aspiration precautions.  For Heart failure patients - Check your Weight same time everyday, if you gain over 2 pounds, or you develop in leg swelling, experience more shortness of breath or chest pain, call your Primary MD immediately. Follow Cardiac Low Salt Diet and 1.5 lit/day fluid restriction.   On your next visit with your primary care physician please Get Medicines reviewed and adjusted.   Please request your Prim.MD to go over all Hospital Tests and Procedure/Radiological results at the follow up, please get all Hospital records sent to your Prim MD by signing hospital release before you go home.   If you experience worsening of your admission symptoms,  develop shortness of breath, life threatening emergency, suicidal or homicidal thoughts you must seek medical attention immediately by calling 911 or calling your MD immediately  if symptoms less severe.  You Must read complete instructions/literature along with all the possible adverse reactions/side effects for all the Medicines you take and that have been prescribed to you. Take any new Medicines after you have completely understood and accpet all the possible adverse reactions/side effects.   Do not drive, operating heavy machinery, perform activities at heights, swimming or participation in water activities or provide baby sitting services if your were admitted for syncope or siezures until you have seen by Primary MD or a Neurologist and advised  to do so again.  Do not drive when taking Pain medications.    Do not take more than prescribed Pain, Sleep and Anxiety Medications  Special Instructions: If you have smoked or chewed Tobacco  in the last 2 yrs please stop smoking, stop any regular Alcohol  and or any Recreational drug use.  Wear Seat belts while driving.   Please note  You were cared for by a hospitalist during your hospital stay. If you have any questions about your discharge medications or the care you received while you were in the hospital after you are discharged, you can call the unit and asked to speak with the hospitalist on call if the hospitalist that took care of you is not available. Once you are discharged, your primary care physician will handle any further medical issues. Please note that NO REFILLS for any discharge medications will be authorized once you are discharged, as it is imperative that you return to your primary care physician (or establish a relationship with a primary care physician if you do not have one) for your aftercare needs so that they can reassess your need for medications and monitor your lab values.     Increase activity slowly    Complete by:   As directed             Medication List    TAKE these medications        albuterol 108 (90 Base) MCG/ACT inhaler  Commonly known as:  PROVENTIL HFA;VENTOLIN HFA  Inhale 2 puffs into the lungs every 6 (six) hours as needed for wheezing or shortness of breath.     ALPRAZolam 1 MG tablet  Commonly known as:  XANAX  Take 1 mg by mouth at bedtime as needed for sleep.     amitriptyline 100 MG tablet  Commonly known as:  ELAVIL  Take 100 mg by mouth at bedtime.     aspirin 81 MG chewable tablet  Chew 81 mg by mouth daily.     benztropine 0.5 MG tablet  Commonly known as:  COGENTIN  Take 0.5 mg by mouth 2 (two) times daily.     BREO ELLIPTA 200-25 MCG/INH Aepb  Generic drug:  fluticasone furoate-vilanterol  Inhale 1 puff into the lungs daily.     cyclobenzaprine 10 MG tablet  Commonly known as:  FLEXERIL  Take 10 mg by mouth 3 (three) times daily as needed for muscle spasms.     esomeprazole 40 MG capsule  Commonly known as:  NEXIUM  Take 40 mg by mouth every morning.     fenofibrate 145 MG tablet  Commonly known as:  TRICOR  Take 145 mg by mouth daily.     ferrous sulfate 325 (65 FE) MG tablet  Take 325 mg by mouth daily with breakfast.     hydrochlorothiazide 25 MG tablet  Commonly known as:  HYDRODIURIL  Take 25 mg by mouth daily.     KLOR-CON M20 20 MEQ tablet  Generic drug:  potassium chloride SA  Take 20 mEq by mouth 2 (two) times daily.     metoprolol tartrate 25 MG tablet  Commonly known as:  LOPRESSOR  Take 25 mg by mouth 2 (two) times daily.     nitroGLYCERIN 0.4 MG SL tablet  Commonly known as:  NITROSTAT  Place 0.4 mg under the tongue every 5 (five) minutes as needed for chest pain.     Omega-3 1000 MG Caps  Take 2,000 mg by mouth daily.  oseltamivir 75 MG capsule  Commonly known as:  TAMIFLU  Take 1 capsule (75 mg total) by mouth 2 (two) times daily.     oxybutynin 5 MG tablet  Commonly known as:  DITROPAN  Take 5 mg by mouth 3 (three)  times daily.     predniSONE 10 MG tablet  Commonly known as:  DELTASONE  Please take 40 mg oral daily  for 3 days, then 30 mg oral daily for 3 days, then 20 mg oral daily for 3 days, then 10 mg oral daily for 3 days then stop.     pregabalin 100 MG capsule  Commonly known as:  LYRICA  Take 100 mg by mouth 2 (two) times daily.     risperiDONE 3 MG tablet  Commonly known as:  RISPERDAL  Take 3 mg by mouth at bedtime.     rosuvastatin 20 MG tablet  Commonly known as:  CRESTOR  Take 20 mg by mouth at bedtime.     ticagrelor 90 MG Tabs tablet  Commonly known as:  BRILINTA  Take 90 mg by mouth 2 (two) times daily.     topiramate 50 MG tablet  Commonly known as:  TOPAMAX  Take 50 mg by mouth daily.     venlafaxine XR 150 MG 24 hr capsule  Commonly known as:  EFFEXOR-XR  Take 300 mg by mouth daily with breakfast.          Diet and Activity recommendation: See Discharge Instructions above   Consults obtained -  none   Major procedures and Radiology Reports - PLEASE review detailed and final reports for all details, in brief -     Dg Chest 2 View  01/29/2016  CLINICAL DATA:  Fever, cough, congestion, shortness of breath, weakness, diarrhea and dizziness for 2 weeks, worsened symptoms today, history COPD, CHF, smoking, fibromyalgia EXAM: CHEST  2 VIEW COMPARISON:  12/30/2015 FINDINGS: Mild enlargement of cardiac silhouette. Coronary arterial stent versus calcification noted. Mediastinal contours and pulmonary vascularity normal. Chronic bronchitic changes. No acute infiltrate, pleural effusion or pneumothorax. Bones demineralized. IMPRESSION: Enlargement of cardiac silhouette with question coronary arterial stent versus coronary calcification. Bronchitic changes without infiltrate Electronically Signed   By: Ulyses Southward M.D.   On: 01/29/2016 13:18    Micro Results     Recent Results (from the past 240 hour(s))  Culture, blood (Routine X 2) w Reflex to ID Panel     Status:  None (Preliminary result)   Collection Time: 01/29/16  3:00 PM  Result Value Ref Range Status   Specimen Description BLOOD LEFT ARM  5 ML IN [facility] BOTTLE  Final   Special Requests NONE  Final   Culture   Final    NO GROWTH 2 DAYS Performed at [facility]    Report Status PENDING  Incomplete  Culture, blood (Routine X 2) w Reflex to ID Panel     Status: None (Preliminary result)   Collection Time: 01/29/16  3:05 PM  Result Value Ref Range Status   Specimen Description BLOOD RIGHT ARM  5 ML IN [facility] BOTTLE  Final   Special Requests NONE  Final   Culture   Final    NO GROWTH 2 DAYS Performed at [facility]    Report Status PENDING  Incomplete  MRSA PCR Screening     Status: None   Collection Time: 01/29/16  4:30 PM  Result Value Ref Range Status   MRSA by PCR NEGATIVE NEGATIVE Final  Comment:        The GeneXpert MRSA Assay (FDA approved for NASAL specimens only), is one component of a comprehensive MRSA colonization surveillance program. It is not intended to diagnose MRSA infection nor to guide or monitor treatment for MRSA infections.        Today   Subjective:   Lydia [name] today has no headache,no chest or  abdominal pain,no new weakness tingling or numbness, dyspnea significantly improved, cough improved as well, currently nonproductive . Objective:   Blood pressure 106/73, pulse 69, temperature 97.8 F (36.6 C), temperature source Oral, resp. rate 22, height  (1.575 m), weight 97.4 kg (214 lb 11.7 oz), SpO2 99 %.   Intake/Output Summary (Last 24 hours) at 02/01/16 1100 Last data filed at 01/31/16 2300  Gross per 24 hour  Intake      0 ml  Output      0 ml  Net      0 ml    Exam Awake Alert, Oriented x 3, No new F.N deficits, Normal affect [place].AT,PERRAL Supple Neck,No JVD, No cervical lymphadenopathy appriciated.  Symmetrical Chest wall movement, Good air movement bilaterally,scattered wheezing . RRR,No Gallops,Rubs or new  Murmurs, No Parasternal Heave +ve B.Sounds, Abd Soft, Non tender, No organomegaly appriciated, No rebound -guarding or rigidity. No Cyanosis, Clubbing or edema, No new Rash or bruise  Data Review   CBC w Diff:  Lab Results  Component Value Date   WBC 9.0 01/31/2016   HGB 10.8* 01/31/2016   HCT 32.8* 01/31/2016   PLT 260 01/31/2016   LYMPHOPCT 16 01/29/2016   MONOPCT 11 01/29/2016   EOSPCT 0 01/29/2016   BASOPCT 0 01/29/2016    CMP:  Lab Results  Component Value Date   NA 139 01/31/2016   K 4.4 01/31/2016   CL 107 01/31/2016   CO2 26 01/31/2016   BUN 11 01/31/2016   CREATININE 0.90 01/31/2016   PROT 6.6 01/30/2016   ALBUMIN 3.0* 01/30/2016   BILITOT 0.2* 01/30/2016   ALKPHOS 47 01/30/2016   AST 19 01/30/2016   ALT 15 01/30/2016  .   Total Time in preparing paper work, data evaluation and todays exam - 35 minutes  Lydia [name] M.D on 02/01/2016 at 11:00 AM  [facility]   Office  [phone]

## 2016-02-03 LAB — CULTURE, BLOOD (ROUTINE X 2)
CULTURE: NO GROWTH
Culture: NO GROWTH

## 2016-04-12 ENCOUNTER — Encounter (HOSPITAL_COMMUNITY): Payer: Self-pay | Admitting: Family Medicine

## 2016-04-12 ENCOUNTER — Emergency Department (HOSPITAL_COMMUNITY)
Admission: EM | Admit: 2016-04-12 | Discharge: 2016-04-12 | Disposition: A | Discharge status: Home / Self Care | Payer: No Typology Code available for payment source | Attending: Emergency Medicine | Admitting: Emergency Medicine

## 2016-04-12 DIAGNOSIS — J449 Chronic obstructive pulmonary disease, unspecified: Secondary | ICD-10-CM | POA: Diagnosis not present

## 2016-04-12 DIAGNOSIS — I509 Heart failure, unspecified: Secondary | ICD-10-CM | POA: Insufficient documentation

## 2016-04-12 DIAGNOSIS — Z79891 Long term (current) use of opiate analgesic: Secondary | ICD-10-CM | POA: Diagnosis not present

## 2016-04-12 DIAGNOSIS — M545 Low back pain: Secondary | ICD-10-CM | POA: Insufficient documentation

## 2016-04-12 DIAGNOSIS — Z79899 Other long term (current) drug therapy: Secondary | ICD-10-CM | POA: Diagnosis not present

## 2016-04-12 DIAGNOSIS — Z7982 Long term (current) use of aspirin: Secondary | ICD-10-CM | POA: Diagnosis not present

## 2016-04-12 DIAGNOSIS — Z7952 Long term (current) use of systemic steroids: Secondary | ICD-10-CM | POA: Insufficient documentation

## 2016-04-12 DIAGNOSIS — Y92410 Unspecified street and highway as the place of occurrence of the external cause: Secondary | ICD-10-CM | POA: Insufficient documentation

## 2016-04-12 DIAGNOSIS — Y939 Activity, unspecified: Secondary | ICD-10-CM | POA: Diagnosis not present

## 2016-04-12 DIAGNOSIS — F17210 Nicotine dependence, cigarettes, uncomplicated: Secondary | ICD-10-CM | POA: Insufficient documentation

## 2016-04-12 DIAGNOSIS — Y999 Unspecified external cause status: Secondary | ICD-10-CM | POA: Diagnosis not present

## 2016-04-12 MED ORDER — IBUPROFEN 600 MG PO TABS
600.0000 mg | ORAL_TABLET | Freq: Four times a day (QID) | ORAL | Status: DC | PRN
Start: 2016-04-12 — End: 2020-01-02

## 2016-04-12 MED ORDER — HYDROCODONE-ACETAMINOPHEN 5-325 MG PO TABS
2.0000 | ORAL_TABLET | ORAL | Status: DC | PRN
Start: 2016-04-12 — End: 2016-04-27
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
## 2016-04-12 NOTE — ED Notes (Signed)
Pt is in stable condition upon d/c and ambulates from ED. 
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
## 2016-04-27 ENCOUNTER — Encounter (HOSPITAL_COMMUNITY): Payer: Self-pay | Admitting: Emergency Medicine

## 2016-04-27 ENCOUNTER — Ambulatory Visit (HOSPITAL_COMMUNITY)
Admission: EM | Admit: 2016-04-27 | Discharge: 2016-04-27 | Disposition: A | Discharge status: Home / Self Care | Payer: Medicare Other | Attending: Emergency Medicine | Admitting: Emergency Medicine

## 2016-04-27 DIAGNOSIS — M545 Low back pain, unspecified: Secondary | ICD-10-CM

## 2016-04-27 MED ORDER — HYDROCODONE-ACETAMINOPHEN 5-325 MG PO TABS: 1.0000 | ORAL_TABLET | Freq: Four times a day (QID) | ORAL | Status: DC | PRN

## 2016-04-27 MED ORDER — KETOROLAC TROMETHAMINE 60 MG/2ML IM SOLN
INTRAMUSCULAR | Status: AC
  Filled 2016-04-27: qty 2

## 2016-04-27 MED ORDER — KETOROLAC TROMETHAMINE 60 MG/2ML IM SOLN
60.0000 mg | Freq: Once | INTRAMUSCULAR | Status: AC
  Administered 2016-04-27: 60 mg via INTRAMUSCULAR

## 2016-04-27 MED ORDER — MELOXICAM 7.5 MG PO TABS: 7.5000 mg | ORAL_TABLET | Freq: Every day | ORAL | Status: DC

## 2016-04-27 NOTE — Discharge Instructions (Signed)
Your back pain is likely coming from the yard work you have been doing. Take meloxicam daily for 5 days, starting tomorrow. Continue your Flexeril. Apply ice to your back for 20 minutes followed by heat for 20 minutes. Do this as often as you can over the next 5 days. Use the hydrocodone every 4-6 hours as needed for severe pain. You may need this regularly for the next 2-3 days while the other medicines take effect. Do not drive while taking this medicine. This will likely take several weeks to fully resolve. Follow-up with your primary care doctor in 1-2 weeks if this is not improving.

## 2016-04-27 NOTE — ED Notes (Signed)
Back pain for 2 days, no known injury.  Patient has been doing yard work recently

## 2016-04-27 NOTE — ED Provider Notes (Signed)
CSN: [id number]     Arrival date & time 04/27/16  1619 History   First MD Initiated Contact with Patient 04/27/16 1652     Chief Complaint  Patient presents with  . Back Pain   (Consider location/radiation/quality/duration/timing/severity/associated sxs/prior Treatment) HPI She is a 50 year old woman here for evaluation of back pain. She states this started about 2 days ago. It is located across her lower back. It does not radiate. No numbness, tingling, weakness of the lower extremities. No bowel or bladder incontinence. No injury or trauma. No recent falls. She does report doing a lot of yard work in the last several days. She states the pain will make her nauseous at times.  She has tried Tylenol and Aleve without improvement. She's also tried heat without improvement.  Past Medical History  Diagnosis Date  . COPD (chronic obstructive pulmonary disease) (HCC)   . Fibromyalgia   . CHF (congestive heart failure) (HCC)   . Depression    Past Surgical History  Procedure Laterality Date  . Abdominal hysterectomy    . Knee surgery Left   . Tonsillectomy     Family History  Problem Relation Age of Onset  . Cancer Father    Social History  Substance Use Topics  . Smoking status: Current Every Day Smoker -- 1.00 packs/day    Types: Cigarettes  . Smokeless tobacco: None  . Alcohol Use: No   OB History    No data available     Review of Systems  As in history of present illness Allergies  Nsaids  Home Medications   Prior to Admission medications   Medication Sig Start Date End Date Taking? Authorizing Provider  albuterol (PROVENTIL HFA;VENTOLIN HFA) 108 (90 Base) MCG/ACT inhaler Inhale 2 puffs into the lungs every 6 (six) hours as needed for wheezing or shortness of breath.     Historical Provider, MD  ALPRAZolam Prudy Feeler) 1 MG tablet Take 1 mg by mouth at bedtime as needed for sleep.     Historical Provider, MD  amitriptyline (ELAVIL) 100 MG tablet Take 100 mg by mouth at  bedtime.     Historical Provider, MD  aspirin 81 MG chewable tablet Chew 81 mg by mouth daily.  01/06/16 01/05/17  Historical Provider, MD  benztropine (COGENTIN) 0.5 MG tablet Take 0.5 mg by mouth 2 (two) times daily.     Historical Provider, MD  cyclobenzaprine (FLEXERIL) 10 MG tablet Take 10 mg by mouth 3 (three) times daily as needed for muscle spasms.    Historical Provider, MD  esomeprazole (NEXIUM) 40 MG capsule Take 40 mg by mouth every morning.     Historical Provider, MD  fenofibrate (TRICOR) 145 MG tablet Take 145 mg by mouth daily.     Historical Provider, MD  ferrous sulfate 325 (65 FE) MG tablet Take 325 mg by mouth daily with breakfast.     Historical Provider, MD  fluticasone furoate-vilanterol (BREO ELLIPTA) 200-25 MCG/INH AEPB Inhale 1 puff into the lungs daily.     Historical Provider, MD  hydrochlorothiazide (HYDRODIURIL) 25 MG tablet Take 25 mg by mouth daily.     Historical Provider, MD  HYDROcodone-acetaminophen (NORCO) 5-325 MG tablet Take 1 tablet by mouth every 6 (six) hours as needed for moderate pain. 04/27/16   Charm Rings, MD  ibuprofen (ADVIL,MOTRIN) 600 MG tablet Take 1 tablet (600 mg total) by mouth every 6 (six) hours as needed. 04/12/16   Barrett Henle, PA-C  meloxicam (MOBIC) 7.5 MG tablet Take 1  tablet (7.5 mg total) by mouth daily. 04/27/16   Charm Rings, MD  metoprolol tartrate (LOPRESSOR) 25 MG tablet Take 25 mg by mouth 2 (two) times daily.  01/06/16 02/05/16  Historical Provider, MD  nitroGLYCERIN (NITROSTAT) 0.4 MG SL tablet Place 0.4 mg under the tongue every 5 (five) minutes as needed for chest pain.  12/08/15 12/07/16  Historical Provider, MD  Omega-3 1000 MG CAPS Take 2,000 mg by mouth daily.     Historical Provider, MD  oseltamivir (TAMIFLU) 75 MG capsule Take 1 capsule (75 mg total) by mouth 2 (two) times daily. 02/01/16   Leana Roe Elgergawy, MD  oxybutynin (DITROPAN) 5 MG tablet Take 5 mg by mouth 3 (three) times daily.     Historical Provider, MD   potassium chloride SA (KLOR-CON M20) 20 MEQ tablet Take 20 mEq by mouth 2 (two) times daily.  08/04/15   Historical Provider, MD  predniSONE (DELTASONE) 10 MG tablet Please take 40 mg oral daily  for 3 days, then 30 mg oral daily for 3 days, then 20 mg oral daily for 3 days, then 10 mg oral daily for 3 days then stop. 02/01/16   Leana Roe Elgergawy, MD  pregabalin (LYRICA) 100 MG capsule Take 100 mg by mouth 2 (two) times daily.     Historical Provider, MD  risperiDONE (RISPERDAL) 3 MG tablet Take 3 mg by mouth at bedtime.     Historical Provider, MD  rosuvastatin (CRESTOR) 20 MG tablet Take 20 mg by mouth at bedtime.     Historical Provider, MD  ticagrelor (BRILINTA) 90 MG TABS tablet Take 90 mg by mouth 2 (two) times daily.  01/06/16   Historical Provider, MD  topiramate (TOPAMAX) 50 MG tablet Take 50 mg by mouth daily.     Historical Provider, MD  venlafaxine XR (EFFEXOR-XR) 150 MG 24 hr capsule Take 300 mg by mouth daily with breakfast.     Historical Provider, MD   Meds Ordered and Administered this Visit   Medications  ketorolac (TORADOL) injection 60 mg (60 mg Intramuscular Given 04/27/16 1725)    BP 113/80 mmHg  Pulse 82  Temp(Src) 98.1 F (36.7 C) (Oral)  Resp 24  SpO2 99% No data found.   Physical Exam  Constitutional: She is oriented to person, place, and time. She appears well-developed and well-nourished. She appears distressed (looks uncomfortable).  Cardiovascular: Normal rate.   Pulmonary/Chest: Effort normal.  Musculoskeletal:  Back: No erythema or edema. She is tender across the lower back at about the L2 level. She does have some spasm in the left paraspinous muscles. Straight leg raise negative bilaterally. 5 out of 5 strength in lower extremities, but pain with testing.  Neurological: She is alert and oriented to person, place, and time.    ED Course  Procedures (including critical care time)  Labs Review Labs Reviewed - No data to display  Imaging Review No  results found.    MDM   1. Bilateral low back pain without sciatica    No red flags on history or exam. Toradol given here. Symptomatic treatment with ice/heat, meloxicam, home Flexeril. Prescription given for hydrocodone and to use for severe pain. Follow-up with primary care doctor if this is not starting to improve in the next 2 weeks.    Charm Rings, MD 04/27/16 [phone]

## 2016-11-30 IMAGING — CR DG CHEST 2V
3 series · 3 of 3 positions shown · non-contrast
Comparison: 12/30/2015

CLINICAL DATA: Fever, cough, congestion, shortness of breath,
weakness, diarrhea and dizziness for 2 weeks, worsened symptoms
today, history COPD, CHF, smoking, fibromyalgia

EXAM:
CHEST  2 VIEW

[w chest lat (1 of 2)]
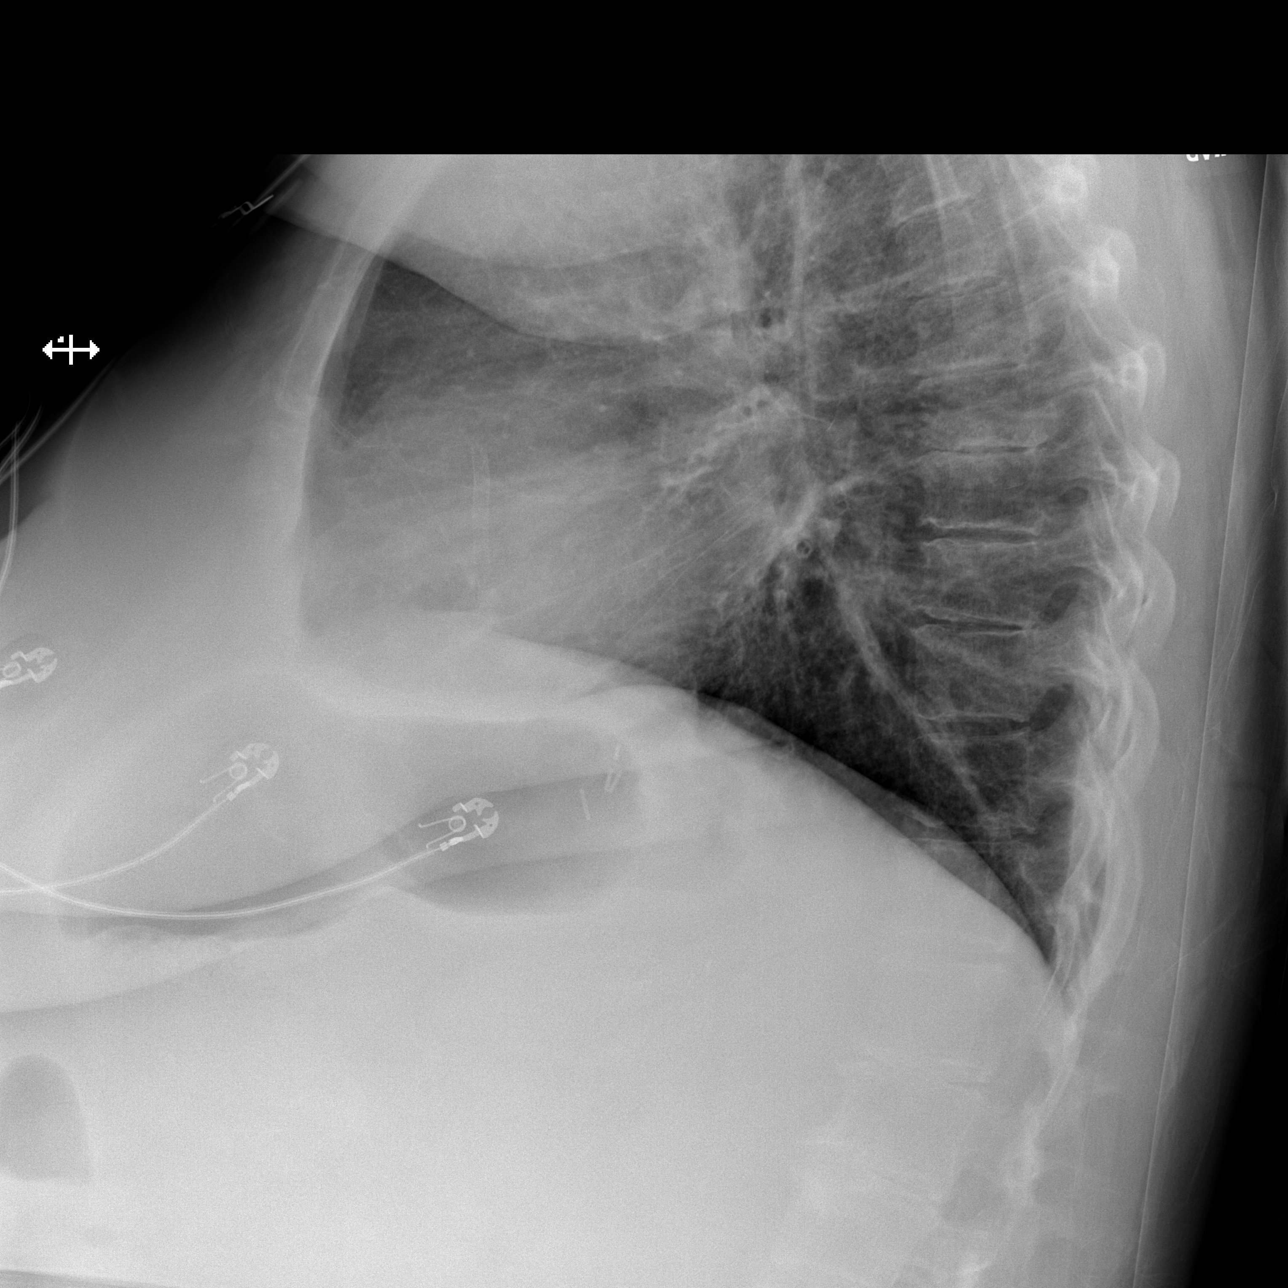

[w chest lat (2 of 2)]
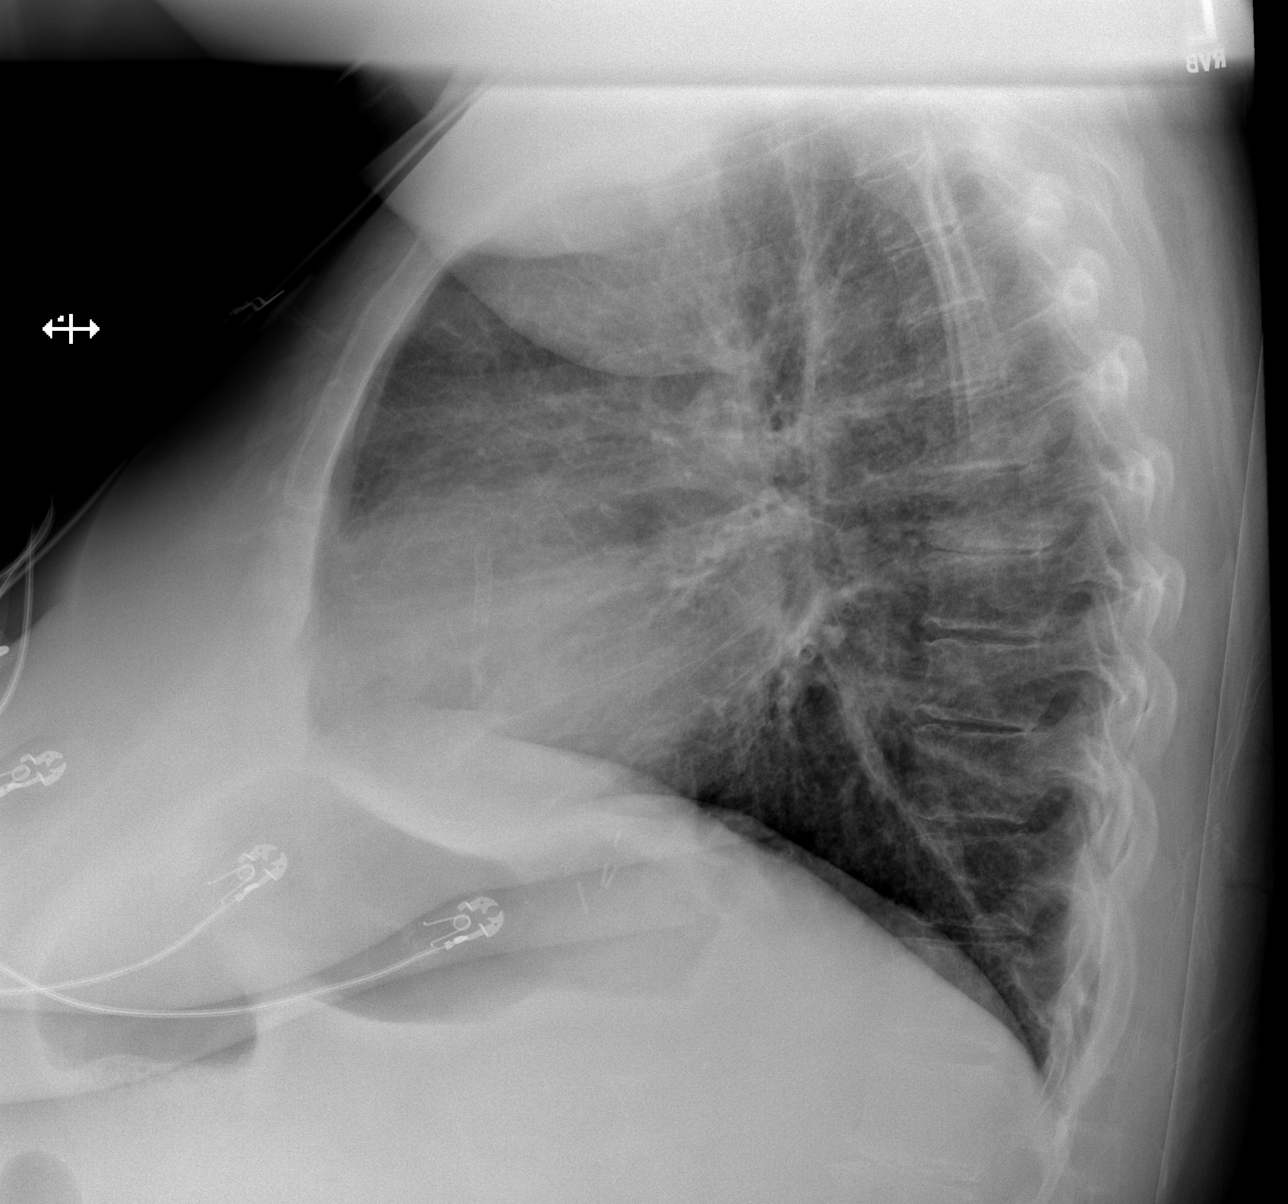

[x chest ap]
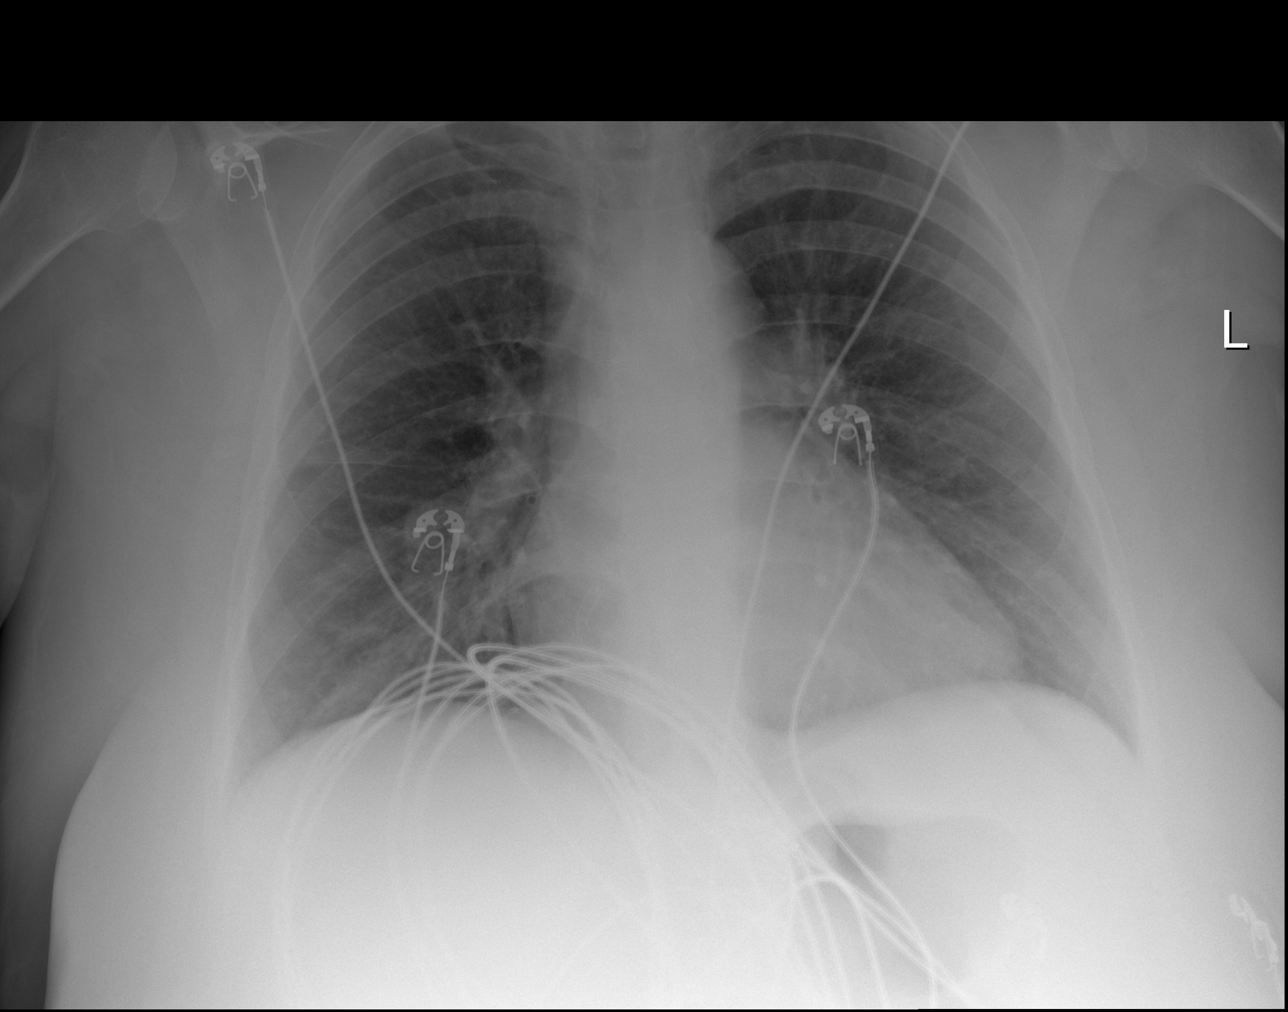

[3 of 3 positions shown; findings below may reference images not displayed]

FINDINGS: Mild enlargement of cardiac silhouette.

Coronary arterial stent versus calcification noted.

Mediastinal contours and pulmonary vascularity normal.

Chronic bronchitic changes.

No acute infiltrate, pleural effusion or pneumothorax.

Bones demineralized.
IMPRESSION: Enlargement of cardiac silhouette with question coronary arterial
stent versus coronary calcification.

Bronchitic changes without infiltrate

## 2017-12-19 DIAGNOSIS — R0602 Shortness of breath: Secondary | ICD-10-CM | POA: Diagnosis not present

## 2017-12-20 DIAGNOSIS — I10 Essential (primary) hypertension: Secondary | ICD-10-CM

## 2017-12-20 DIAGNOSIS — J9601 Acute respiratory failure with hypoxia: Secondary | ICD-10-CM

## 2017-12-21 DIAGNOSIS — I10 Essential (primary) hypertension: Secondary | ICD-10-CM | POA: Diagnosis not present

## 2017-12-21 DIAGNOSIS — J9601 Acute respiratory failure with hypoxia: Secondary | ICD-10-CM | POA: Diagnosis not present

## 2017-12-21 DIAGNOSIS — R0602 Shortness of breath: Secondary | ICD-10-CM | POA: Diagnosis not present

## 2019-12-16 ENCOUNTER — Ambulatory Visit (INDEPENDENT_AMBULATORY_CARE_PROVIDER_SITE_OTHER): Payer: Medicare Other | Admitting: Orthopaedic Surgery

## 2019-12-16 ENCOUNTER — Encounter: Payer: Self-pay | Admitting: Orthopaedic Surgery

## 2019-12-16 ENCOUNTER — Other Ambulatory Visit: Payer: Self-pay

## 2019-12-16 DIAGNOSIS — M1712 Unilateral primary osteoarthritis, left knee: Secondary | ICD-10-CM | POA: Diagnosis not present

## 2019-12-16 NOTE — Progress Notes (Signed)
Office Visit Note   Patient: Lydia [name]           Date of Birth: 1966/05/05           MRN: [id number] Visit Date: 12/16/2019              Requested by: Imagene Riches, NP [place],  [place] [address] PCP: Imagene Riches, NP   Assessment & Plan: Visit Diagnoses:  1. Unilateral primary osteoarthritis, left knee     Plan: Patient will need preoperative clearance from her cardiologist for total knee arthroplasty.  She has some mild to moderate mitral regurg by transesophageal echo.  She is on chronic Suboxone.  Her knee is her principal painful problem.  We discussed total knee arthroplasty with spinal anesthesia with her cardiac history.  Use of preop adductor block, Exparel and Marcaine postop and overnight stay in the hospital.  She has a daughter who works doing Ambulance person for school type program and will need to be available to help her for several days after the surgery when she came home.  Home PT x2 weeks followed by outpatient therapy in [place] for 2 or 3 weeks postop.  Questions were elicited and answered she understands request to proceed.  Follow-Up Instructions: No follow-ups on file.   Orders:  No orders of the defined types were placed in this encounter.  No orders of the defined types were placed in this encounter.     Procedures: No procedures performed   Clinical Data: No additional findings.   Subjective: Chief Complaint  Patient presents with  . Left Knee - Pain    HPI 54 year old female referred by Heide Scales NP-C for severe left knee osteoarthritis and left knee arthroplasty.  Patient long-term smoker.  She goes to a Suboxone clinic.  She has had previous cortisone injections with no relief previous knee arthroscopy several years ago.  She has some right knee osteoarthritis but not as severe as the left.  She has been amatory with a limp using a cane.  She is used [employer].  Patient's cardiac patient has had previous stent placement  also history of chronic failure due to moderate to severe mitral regurgitation.  She sees Dr. Abran Richard in [place] which is part of [facility].  Last visit was December 2020.  She has mitral valve regurg severe by transthoracic echo and mild to moderate by TEE on 03/2019.  Coronary artery disease previous stents x3 right coronary artery 2017, CTO and distal 1M.  Patient has multiple injections in her left knee without relief.  Radiographs left knee 12/04/2019 shows bone-on-bone changes medial compartment multiple loose bodies marginal osteophyte subchondral sclerosis 1 to 2cm patellofemoral osteophytes and subchondral sclerosis.  Positive history of hypertension, COPD continue tobacco use, hyperlipidemia. Review of Systems positive smoking history previous cardiac stents no current angina no current dyspnea.  Positive for left knee pain and limping.   Objective: Vital Signs: BP 97/74   Pulse (!) 109   Ht 5\' 2"  (1.575 m)   Wt 195 lb (88.5 kg)   BMI 35.67 kg/m   Physical Exam Constitutional:      Appearance: She is well-developed.  HENT:     Head: Normocephalic.     Right Ear: External ear normal.     Left Ear: External ear normal.  Eyes:     Pupils: Pupils are equal, round, and reactive to light.  Neck:     Thyroid:  No thyromegaly.     Trachea: No tracheal deviation.  Cardiovascular:     Rate and Rhythm: Normal rate.  Pulmonary:     Effort: Pulmonary effort is normal. No respiratory distress.     Breath sounds: No wheezing.  Abdominal:     Palpations: Abdomen is soft.  Skin:    General: Skin is warm and dry.  Neurological:     Mental Status: She is alert and oriented to person, place, and time.  Psychiatric:        Behavior: Behavior normal.     Ortho Exam negative logroll the hips distal pulse palpable 2+ dorsalis pedis posterior tib.  No lower extremity edema.  Severe crepitus with left knee range of motion she has 10 to 115 degrees range of  motion collateral ligaments are stable.  3+ joint effusion prominence in the popliteal region consistent with Baker's cyst.  Right knee has no effusion full extension good flexion no significant crepitus.  Specialty Comments:  No specialty comments available.  Imaging: No results found.   PMFS History: Patient Active Problem List   Diagnosis Date Noted  . Unilateral primary osteoarthritis, left knee 12/16/2019  . CAD (coronary artery disease) 02/01/2016  . Sepsis (HCC) 01/29/2016  . Respiratory failure (HCC) 01/29/2016  . Influenza   . Chronic obstructive pulmonary disease with acute exacerbation (HCC)   . Cough    Past Medical History:  Diagnosis Date  . CHF (congestive heart failure) (HCC)   . COPD (chronic obstructive pulmonary disease) (HCC)   . Depression   . Fibromyalgia     Family History  Problem Relation Age of Onset  . Cancer Father     Past Surgical History:  Procedure Laterality Date  . ABDOMINAL HYSTERECTOMY    . KNEE SURGERY Left   . TONSILLECTOMY     Social History   Occupational History  . Not on file  Tobacco Use  . Smoking status: Current Every Day Smoker    Packs/day: 1.00    Types: Cigarettes  . Smokeless tobacco: Never Used  Substance and Sexual Activity  . Alcohol use: No  . Drug use: No  . Sexual activity: Not on file

## 2019-12-23 ENCOUNTER — Encounter (HOSPITAL_COMMUNITY): Payer: Self-pay

## 2019-12-23 NOTE — Progress Notes (Signed)
[id number] [place], [place] - [address] AT [facility] OF [address] [place] [address] Phone: [phone] Fax: [phone]  [facility] - [place], [place] - [address] [place] [address] Phone: [phone] Fax: [phone]    Your procedure is scheduled on Wednesday, March 3rd.  Report to [facility] Main Entrance "A" at 1:10 P.M., and check in at the Admitting office.  Call this number if you have problems the morning of surgery:  [phone]  Call [phone] if you have any questions prior to your surgery date Monday-Friday 8am-4pm   Remember:  Do not eat after midnight the night before your surgery  You may drink clear liquids until 12:10 P.M. the afternoon of your surgery.   Clear liquids allowed are: Water, Non-Citrus Juices (without pulp), Carbonated Beverages, Clear Tea, Black Coffee Only, and Gatorade   Enhanced Recovery after Surgery for Orthopedics Enhanced Recovery after Surgery is a protocol used to improve the stress on your body and your recovery after surgery.  Patient Instructions  . The night before surgery:  o No food after midnight. ONLY clear liquids after midnight  .  Marland Kitchen The day of surgery (if you do NOT have diabetes):  o Drink ONE (1) Pre-Surgery Clear Ensure as directed.   o This drink was given to you during your hospital  pre-op appointment visit. o Finish the drink at the designated time by 12:10 P.M. the afternoon of your surgery.  o Nothing else to drink after completing the  Pre-Surgery Clear Ensure.         If you have questions, please contact your surgeon's office.   Take these medicines the morning of surgery with A SIP OF WATER  benztropine (COGENTIN) ezetimibe (ZETIA)  fenofibrate (TRICOR) fluticasone (FLONASE)/ nasal spray isosorbide mononitrate (IMDUR)  metoprolol tartrate (LOPRESSOR) pantoprazole (PROTONIX)    solifenacin (VESICARE) TOVIAZ  venlafaxine XR (EFFEXOR-XR)   If needed - albuterol (PROVENTIL HFA;VENTOLIN HFA)  cyclobenzaprine (FLEXERIL), nitroGLYCERIN (NITROSTAT),    7 days prior to surgery STOP taking any Aspirin (unless otherwise instructed by your surgeon), meloxicam (MOBIC), Aleve, Naproxen, Ibuprofen, Motrin, Advil, Goody's, BC's, all herbal medications, fish oil, and all vitamins.  3 days prior to surgery STOP taking Buprenorphine HCl-Naloxone HCl unless otherwise instructed by the prescriber.   The Morning of Surgery  Do not wear jewelry, make-up or nail polish.  Do not wear lotions, powders, or perfumes, or deodorant  Do not shave 48 hours prior to surgery.    Do not bring valuables to the hospital.  [facility] is not responsible for any belongings or valuables.  If you are a smoker, DO NOT Smoke 24 hours prior to surgery  If you wear a CPAP at night please bring your mask the morning of surgery   Remember that you must have someone to transport you home after your surgery, and remain with you for 24 hours if you are discharged the same day.  Please bring cases for contacts, glasses, hearing aids, dentures or bridgework because it cannot be worn into surgery.   Leave your suitcase in the car.  After surgery it may be brought to your room.  For patients admitted to the hospital, discharge time will be determined by your treatment team.  Patients discharged the day of surgery will not be allowed to drive home.   Special instructions:   [facility]- Preparing For Surgery  Before surgery,  you can play an important role. Because skin is not sterile, your skin needs to be as free of germs as possible. You can reduce the number of germs on your skin by washing with CHG (chlorahexidine gluconate) Soap before surgery.  CHG is an antiseptic cleaner which kills germs and bonds with the skin to continue killing germs even after washing.    Oral Hygiene is also important to  reduce your risk of infection.  Remember - BRUSH YOUR TEETH THE MORNING OF SURGERY WITH YOUR REGULAR TOOTHPASTE  Please do not use if you have an allergy to CHG or antibacterial soaps. If your skin becomes reddened/irritated stop using the CHG.  Do not shave (including legs and underarms) for at least 48 hours prior to first CHG shower. It is OK to shave your face.  Please follow these instructions carefully.   1. Shower the NIGHT BEFORE SURGERY and the MORNING OF SURGERY with CHG Soap.   2. If you chose to wash your hair, wash your hair first as usual with your normal shampoo.  3. After you shampoo, rinse your hair and body thoroughly to remove the shampoo.  4. Use CHG as you would any other liquid soap. You can apply CHG directly to the skin and wash gently with a scrungie or a clean washcloth.   5. Apply the CHG Soap to your body ONLY FROM THE NECK DOWN.  Do not use on open wounds or open sores. Avoid contact with your eyes, ears, mouth and genitals (private parts). Wash Face and genitals (private parts)  with your normal soap.   6. Wash thoroughly, paying special attention to the area where your surgery will be performed.  7. Thoroughly rinse your body with warm water from the neck down.  8. DO NOT shower/wash with your normal soap after using and rinsing off the CHG Soap.  9. Pat yourself dry with a CLEAN TOWEL.  10. Wear CLEAN PAJAMAS to bed the night before surgery, wear comfortable clothes the morning of surgery  11. Place CLEAN SHEETS on your bed the night of your first shower and DO NOT SLEEP WITH PETS.  Day of Surgery: Please shower the morning of surgery with the CHG soap Do not apply any deodorants/lotions. Please wear clean clothes to the hospital/surgery center.   Remember to brush your teeth WITH YOUR REGULAR TOOTHPASTE.  Please read over the following fact sheets that you were given.

## 2019-12-24 ENCOUNTER — Ambulatory Visit (HOSPITAL_COMMUNITY)
Admission: RE | Admit: 2019-12-24 | Discharge: 2019-12-24 | Disposition: A | Discharge status: Home / Self Care | Payer: Medicare Other | Source: Ambulatory Visit | Attending: Surgery | Admitting: Surgery

## 2019-12-24 ENCOUNTER — Encounter (HOSPITAL_COMMUNITY)
Admission: RE | Admit: 2019-12-24 | Discharge: 2019-12-24 | Disposition: A | Discharge status: Home / Self Care | Payer: Medicare Other | Source: Ambulatory Visit | Attending: Orthopaedic Surgery | Admitting: Orthopaedic Surgery

## 2019-12-24 ENCOUNTER — Encounter (HOSPITAL_COMMUNITY): Payer: Self-pay

## 2019-12-24 ENCOUNTER — Other Ambulatory Visit: Payer: Self-pay

## 2019-12-24 DIAGNOSIS — Z01818 Encounter for other preprocedural examination: Secondary | ICD-10-CM | POA: Insufficient documentation

## 2019-12-24 HISTORY — DX: Dyspnea, unspecified: R06.00

## 2019-12-24 HISTORY — DX: Pneumonia, unspecified organism: J18.9

## 2019-12-24 HISTORY — DX: Rheumatoid arthritis, unspecified: M06.9

## 2019-12-24 HISTORY — DX: Angina pectoris, unspecified: I20.9

## 2019-12-24 HISTORY — DX: Atherosclerotic heart disease of native coronary artery without angina pectoris: I25.10

## 2019-12-24 HISTORY — DX: Sleep apnea, unspecified: G47.30

## 2019-12-24 HISTORY — DX: Anxiety disorder, unspecified: F41.9

## 2019-12-24 HISTORY — DX: Nonrheumatic mitral (valve) insufficiency: I34.0

## 2019-12-24 HISTORY — DX: Personal history of other diseases of the digestive system: Z87.19

## 2019-12-24 HISTORY — DX: Other specified health status: Z78.9

## 2019-12-24 LAB — COMPREHENSIVE METABOLIC PANEL
ALT: 11 U/L (ref 0–44)
AST: 15 U/L (ref 15–41)
Albumin: 3.6 g/dL (ref 3.5–5.0)
Alkaline Phosphatase: 46 U/L (ref 38–126)
Anion gap: 10 (ref 5–15)
BUN: 9 mg/dL (ref 6–20)
CO2: 25 mmol/L (ref 22–32)
Calcium: 9 mg/dL (ref 8.9–10.3)
Chloride: 100 mmol/L (ref 98–111)
Creatinine, Ser: 1.33 mg/dL — ABNORMAL HIGH (ref 0.44–1.00)
GFR calc Af Amer: 52 mL/min — ABNORMAL LOW (ref >60)
GFR calc non Af Amer: 45 mL/min — ABNORMAL LOW (ref >60)
Glucose, Bld: 90 mg/dL (ref 70–99)
Potassium: 4 mmol/L (ref 3.5–5.1)
Sodium: 135 mmol/L (ref 135–145)
Total Bilirubin: 0.6 mg/dL (ref 0.3–1.2)
Total Protein: 6.9 g/dL (ref 6.5–8.1)

## 2019-12-24 LAB — CBC
HCT: 37.7 % (ref 36.0–46.0)
Hemoglobin: 11.8 g/dL — ABNORMAL LOW (ref 12.0–15.0)
MCH: 30.8 pg (ref 26.0–34.0)
MCHC: 31.3 g/dL (ref 30.0–36.0)
MCV: 98.4 fL (ref 80.0–100.0)
Platelets: 305 10*3/uL (ref 150–400)
RBC: 3.83 MIL/uL — ABNORMAL LOW (ref 3.87–5.11)
RDW: 13.9 % (ref 11.5–15.5)
WBC: 7.4 10*3/uL (ref 4.0–10.5)
nRBC: 0 % (ref 0.0–0.2)

## 2019-12-24 LAB — SURGICAL PCR SCREEN
MRSA, PCR: NEGATIVE
Staphylococcus aureus: NEGATIVE

## 2019-12-24 LAB — URINALYSIS, ROUTINE W REFLEX MICROSCOPIC
Bilirubin Urine: NEGATIVE
Glucose, UA: NEGATIVE mg/dL
Hgb urine dipstick: NEGATIVE
Ketones, ur: NEGATIVE mg/dL
Leukocytes,Ua: NEGATIVE
Nitrite: NEGATIVE
Protein, ur: NEGATIVE mg/dL
Specific Gravity, Urine: 1.016 (ref 1.005–1.030)
pH: 6 (ref 5.0–8.0)

## 2019-12-24 NOTE — Progress Notes (Addendum)
Anesthesia Chart Review:  Case: [id number] Date/Time: 12/31/19 1455   Procedure: LEFT TOTAL KNEE ARTHROPLASTY (Left Knee)   Anesthesia type: Spinal   Pre-op diagnosis: left knee osteoarthritis   Location: [facility] OR ROOM 03 / [facility]   Surgeons: Marybelle Killings, MD      DISCUSSION: Patient is a 54 year old female scheduled for the above procedure.  History includes smoking (1ppd, but trying to quit before surgery, down to 1-3 cig/day), COPD, home O2 (2L at night), OSA (intolerant to CPAP), exertional dyspnea, CAD (DES RCA x3 01/05/16), CHF, mitral regurgitation (moderate MR 03/2019), RA, hiatal hernia, fibromyalgia, hysterectomy.  Recorded HR at PAT ~ 109-115 bpm. Reported line for Val[employer] parking was long, so she parked in the parking deck and had to walk an incline into the hospital.  She did have to stop and rest for about 1 minutes, but still with dyspnea on arrival. Even at baseline, she reports she has dyspnea when walking through her house which is not new.  She in intolerant to CPAP but uses 2L/[place] at night and also PRN if she has URI or doing particularly strenuous activity. She is able to do light house work, but has to sit and rest intermittently.  She can not work for 20 minutes without having to rest. If she exerts herself too much she may feel dizzy, but no syncope. She reports compliance with her medications and uses albuterol 1-2x/day. She doesn't lie flat and thinks she would feel SOB if she did so. She sleep on her side at about a 45 degree angle. She gets mild ankle edema. She reports chest discomfort "all the time" for years. She describes it as sharp and worse with deep breaths. She denied any recent Nitro use. She has had a cardiac cath in 2019 as part of the work-up with 1V CAD not suitable for intervention (see below). She was seen by her cardiologist Dr. Otho Perl last in 09/2019. He signed a surgical clearance note indicating he felt she was at "low risk" from a cardiac standpoint. At her last visit  he advised her to get back in to see her pulmonologist Dr. Alcide Clever due to wheezes and rhonchi--she reports she has not seen him for about a year, but feels her breathing has been very stable.  Lung sounds were clear at PAT exam and sats good, although with subtle conversational dyspnea which may have been exacerbated by her face mask and long walk to PAT office from the parking deck.   PAT EKG with automated reading of accelerated junctional at 109 bpm.  Rhythm is regular, but couldn't definitively saw there were p-waves. She denied any new/progressive symptoms since she was seen by Dr. Otho Perl in December. I don't have a recent comparison EKG, so I spoke with Lattie Haw at Dr. Sudie Grumbling office. He reviewed and called me back--he thinks EKG is likely underlying sinus tachycardia (p waves in V5). No additional preoperative cardiology recommendations.   In regards to her pulmonary status, although she feels that her breathing is stable, it took her most of her PAT visit to feel recovered from her walk from the parking deck, and she has chronic DOE with low impact activity even at baseline and uses 2L/[place] at night and PRN. She reports it has been about a year since her last pulmonology visit, but I don't have those records yet. I briefly discussed with her anesthesia options--case posted for spinal, although if she is not able to tolerate (can't lie flat) then  she may be considered for general anesthesia but with underlying COPD/smoking, she may require pulmonology input for risk assessment. I discussed this and her history with anesthesiologist Leslye Peer, MD. It's possible she could be placed at a mild incline for surgery if spinal used, but given her pulmonary history, would recommend preoperative pulmonology input regardless. Dr. Judithe Modest would recommend this as well. I have notified Debbie at Dr. Ophelia Charter' office, and she has arranged for patient to be seen at Dr. Terrilee Croak office on 12/29/19 at 9:00 AM.   Preoperative COVID-19  test is scheduled for 12/27/19. Chart will be left for follow-up regarding pulmonology risk assessment and CXR result.   ADDENDUM 12/30/19 10:43 AM: Preoperative CXR and COVID-19 test negative for acute findings. Accelerated junctional rhythm interpretation remains on 12/24/19 preoperative EKG--again this was reviewed with her primary cardiologist Dr. Judithe Modest with now new recommendations from a cardiac standpoint. She was evaluated by pulmonologist Dr. Blenda Nicely on 12/29/19 for preoperative evaluation. He wrote, "Patient is at moderate risk for general surgery due to respiratory issues.  I think that the patient should be given a dose of solumedrol 125mg  IV a few hours before surgery and should also be given a nebulizer treatment an hour before and also after anesthesia.  Patient should be on the lowest oxygen (post surgery) to keep the pulseox between 90-92%.  Patient may need CPAP after anesthesia." Anesthesia team to evaluate on arrival.    VS: BP 104/80   Pulse (!) 115   Temp 36.8 C   Resp 20   Ht 5\' 2"  (1.575 m)   Wt 86.5 kg   SpO2 100%   BMI 34.90 kg/m  HR 109 on EKG. Heart rhythm regular, mildly tachycardic. Occasional ectopic beat. Lung clear without wheezes, crackles or rhonchi. No carotid bruits notes. No significant edema noted. She reports she is edentulous (uses upper dentures).    PROVIDERS: , NP is PCP  , MD is cardiologist [facility]). Phone [phone]. -She does not see rheumatologist and is not on RA specific medications.   LABS: Labs reviewed: Acceptable for surgery. (all labs ordered are listed, but only abnormal results are displayed)  Labs Reviewed  SURGICAL PCR SCREEN  CBC  COMPREHENSIVE METABOLIC PANEL  URINALYSIS, ROUTINE W REFLEX MICROSCOPIC     IMAGES: CXR 12/24/19:  FINDINGS: Heart size is mildly enlarged but stable. The hila and mediastinum are normal. No pneumothorax. No nodules or masses. No focal infiltrates. No  other acute abnormalities. IMPRESSION: No active cardiopulmonary disease.   EKG: 12/24/19: Preliminary: Accelerated Junctional rhythm Abnormal ECG - Reviewed by her primary cardiologist Dr. 12/26/19 who felt tracing was likely underlying ST. No additional preoperative cardiology recommendations.   CV: TEE 04/02/19: Summary: The estimated LV ejection fraction is 65% Normal right ventricular size and function. Mild to Moderate mitral regurgitation. Mild mitral stenosis.  TTE 03/03/19: Summary Normal left ventricular size and systolic function with no appreciable segmental abnormality. Ejection fraction is visually estimated at 60-65% Diastolic function Appears normal Thickened mitral valve leaflets with tethering of the anterior leaflet. Mild mitral annular calcification. Moderate mitral regurgitation. There is mild aortic sclerosis noted, with no evidence of stenosis.  Cardiac cath 11/10/17: LMCA: 0% and Normal. LAD: Normal. LCx: Abnormal and Multiple stenosis.  Lesion on Prox CX: Distal subsection.50% stenosis 6 mm length . Pre  procedure TIMI III flow was noted. Good run off was present. The lesion was  diagnosed as Low Risk (A). The lesion was eccentric and lightly  calcified.The lesion showed evidence of not present thrombus and with  smooth contour.  Comments:iFR normal through proximal lesion  Lesion on 1st Ob Marg: Distal subsection.100% stenosis 10 mm length . Pre  procedure TIMI I flow was noted. Poor run off was present.  Comments:OM1 sub-branch distal CTO collateralized retrograde from diagonal  and dominant OM1 sub-branch RCA: Normal. Diagnostic Procedure Summary Normal LV systolic function. Single vessel CAD consisting of chronic distal OM occlusion and normal iFR at proximal Circ lesion. disease is angiographically stable from prior study. I have reviewed the recent history and physical documentation. I personally spent 20 minutes continuously monitoring  the patient during the administration of moderate sedation. Pre and post activities have been reviewed. I was present for the entire procedure. Diagnostic Procedure Recommendations 1. continued guideline directed secondary prevention therapy 2. medical therapy for patient's 1vessel CAD. Anatomy not appropriate for PCI   Past Medical History:  Diagnosis Date  . Anginal pain (HCC)    per patient  . Anxiety    per patient   . CHF (congestive heart failure) (HCC)   . COPD (chronic obstructive pulmonary disease) (HCC)   . Coronary artery disease   . Depression   . Dyspnea    per patient due to COPD, and with walking long distances per patient  . Fibromyalgia   . History of hiatal hernia   . On supplemental oxygen by nasal cannula    per patient 2L at night  . Pneumonia   . Rheumatoid arthritis (HCC)    per patient diagnosed within the past 2 years, stated on 12/24/2019  . Sleep apnea    per patient diagnosed about 3-4 years ago and does not use a CPAP at night    Past Surgical History:  Procedure Laterality Date  . ABDOMINAL HYSTERECTOMY    . KNEE SURGERY Left   . TONSILLECTOMY      MEDICATIONS: . albuterol (PROVENTIL HFA;VENTOLIN HFA) 108 (90 Base) MCG/ACT inhaler  . benztropine (COGENTIN) 0.5 MG tablet  . Buprenorphine HCl-Naloxone HCl 8-2 MG FILM  . buPROPion (WELLBUTRIN) 100 MG tablet  . cetirizine (ZYRTEC) 10 MG tablet  . cyclobenzaprine (FLEXERIL) 10 MG tablet  . ezetimibe (ZETIA) 10 MG tablet  . fenofibrate (TRICOR) 145 MG tablet  . fluticasone (FLONASE) 50 MCG/ACT nasal spray  . folic acid (FOLVITE) 1 MG tablet  . hydrochlorothiazide (HYDRODIURIL) 25 MG tablet  . HYDROcodone-acetaminophen (NORCO) 5-325 MG tablet  . ibuprofen (ADVIL,MOTRIN) 600 MG tablet  . isosorbide mononitrate (IMDUR) 60 MG 24 hr tablet  . magnesium oxide (MAG-OX) 400 MG tablet  . meloxicam (MOBIC) 7.5 MG tablet  . metoprolol tartrate (LOPRESSOR) 25 MG tablet  . montelukast (SINGULAIR) 10  MG tablet  . nitroGLYCERIN (NITROSTAT) 0.4 MG SL tablet  . oseltamivir (TAMIFLU) 75 MG capsule  . OXYGEN  . pantoprazole (PROTONIX) 40 MG tablet  . potassium chloride SA (KLOR-CON M20) 20 MEQ tablet  . predniSONE (DELTASONE) 10 MG tablet  . risperiDONE (RISPERDAL) 3 MG tablet  . solifenacin (VESICARE) 10 MG tablet  . topiramate (TOPAMAX) 50 MG tablet  . TOVIAZ 8 MG TB24 tablet  . venlafaxine XR (EFFEXOR-XR) 150 MG 24 hr capsule  . Vitamin D, Ergocalciferol, (DRISDOL) 1.25 MG (50000 UNIT) CAPS capsule   No current facility-administered medications for this encounter.  She is not currently on prednisone or Tamiflu.  She is also on ASA 81 mg daily and is waiting on instructions from surgeon.    Shonna Chock, PA-C Surgical Short Stay/Anesthesiology  [facility] Phone [phone] [facility] Phone [phone] 12/24/2019 4:46 PM

## 2019-12-24 NOTE — Progress Notes (Signed)
PCP - Mauricio Po, NP Cardiologist - Dr. Loretha Brasil Pulmonologist - Dr Marcellus Scott - per patient has not been seen in over a year - records requested  PPM/ICD - denies  Chest x-ray - 12/24/2019 EKG - 12/24/2019 Stress Test - per patient 2 or more years ago ECHO - 04/02/2019 ([facility].) Cardiac Cath - 11/10/17 (C.E)  Sleep Study - per patient diagnosed about 3-4 years ago and does not wear a CPAP   Blood Thinner Instructions: N/A Aspirin Instructions: N/A  ERAS Protcol - Yes PRE-SURGERY Ensure or G2- Ensure given  COVID TEST- Scheduled for 12/27/2019. Patient verbalized understanding of self-quarantine instructions, appointment time and place.  Anesthesia review: YES, cardiac history, pre-op clearance, on 2L O2/nightly,  records requested   Patient denies shortness of breath, fever, cough and chest pain at PAT appointment   All instructions explained to the patient, with a verbal understanding of the material. Patient agrees to go over the instructions while at home for a better understanding. Patient also instructed to self quarantine after being tested for COVID-19. The opportunity to ask questions was provided.

## 2019-12-25 ENCOUNTER — Ambulatory Visit (INDEPENDENT_AMBULATORY_CARE_PROVIDER_SITE_OTHER): Payer: Medicare Other | Admitting: Surgery

## 2019-12-25 ENCOUNTER — Encounter: Payer: Self-pay | Admitting: Surgery

## 2019-12-25 ENCOUNTER — Other Ambulatory Visit: Payer: Self-pay

## 2019-12-25 VITALS — BP 101/65 | HR 85 | Ht 62.0 in | Wt 190.0 lb

## 2019-12-25 DIAGNOSIS — M1712 Unilateral primary osteoarthritis, left knee: Secondary | ICD-10-CM

## 2019-12-25 NOTE — Progress Notes (Signed)
54 year old white female history of end-stage DJD left knee and pain comes in for preop evaluation.  States that knee symptoms unchanged from previous visit.  She is wanting to proceed with left total knee replacement as scheduled December 31, 2019.  We have received preop cardiac clearance from Dr. Judithe Modest.  Patient is scheduled to see pulmonologist for clearance March 1.  Patient admits to having a cough, chronic exertional chest pain and shortness of breath.  Admits orthopnea and states that she is a side sleeper.  History of sleep apnea but has chosen not to use CPAP.  In the evening she uses home O2 2 L nasal cannula.  Plan Surgical procedure discussed along with potential hospital stay.  We will await clearance from pulmonology.  All questions answered.

## 2019-12-27 ENCOUNTER — Other Ambulatory Visit (HOSPITAL_COMMUNITY)
Admission: RE | Admit: 2019-12-27 | Discharge: 2019-12-27 | Disposition: A | Discharge status: Home / Self Care | Payer: Medicare Other | Source: Ambulatory Visit | Attending: Orthopaedic Surgery | Admitting: Orthopaedic Surgery

## 2019-12-27 DIAGNOSIS — Z01812 Encounter for preprocedural laboratory examination: Secondary | ICD-10-CM | POA: Diagnosis present

## 2019-12-27 DIAGNOSIS — Z20822 Contact with and (suspected) exposure to covid-19: Secondary | ICD-10-CM | POA: Diagnosis not present

## 2019-12-27 LAB — SARS CORONAVIRUS 2 (TAT 6-24 HRS): SARS Coronavirus 2: NEGATIVE

## 2019-12-30 ENCOUNTER — Other Ambulatory Visit: Payer: Self-pay

## 2019-12-30 ENCOUNTER — Telehealth: Payer: Self-pay | Admitting: Orthopaedic Surgery

## 2019-12-30 MED ORDER — BUPIVACAINE LIPOSOME 1.3 % IJ SUSP
20.0000 mL | Freq: Once | INTRAMUSCULAR | Status: DC
  Filled 2019-12-30 (×3): qty 20

## 2019-12-30 NOTE — Telephone Encounter (Signed)
FYI:   Patient is scheduled for left total knee at [facility] with Dr Ophelia Charter tomorrow 12-31-19.  Patient was seen yesterday by pulmonologist Dr. Marcellus Scott for surgical clearance.  Office visit notes have been faxed to the attention of Revonda Standard with [facility] Short Stay Anesthesia.   Dr. Blenda Nicely notes read as follows:    Patient is at moderate risk for general surgery due to respiratory issues. I think that the patient should be given a dose of solumedrol 125mg  IV a few hours before surgery and should also be given a nebulizer treatment an hour before and also after anesthesia.  Patient should be on the lowest oxygen (post surgery) to keep the pulsox between 90-92%.  Patient may need CPAP after anesthesia.  I would be glad to assist you and please do not hesitate to call.

## 2019-12-30 NOTE — Telephone Encounter (Signed)
FYI

## 2019-12-30 NOTE — Anesthesia Preprocedure Evaluation (Addendum)
Anesthesia Evaluation  Patient identified by MRN, date of birth, ID band Patient awake    Reviewed: Allergy & Precautions, H&P , NPO status , Patient's Chart, lab work & pertinent test results  Airway Mallampati: III  TM Distance: >3 FB Neck ROM: Full    Dental no notable dental hx. (+) Edentulous Upper, Edentulous Lower, Dental Advisory Given   Pulmonary sleep apnea , COPD, Current Smoker and Patient abstained from smoking.,     + decreased breath sounds      Cardiovascular + CAD, + Cardiac Stents and +CHF  + Valvular Problems/Murmurs MR  Rhythm:Regular Rate:Normal     Neuro/Psych Anxiety Depression negative neurological ROS     GI/Hepatic negative GI ROS, Neg liver ROS,   Endo/Other  Morbid obesity  Renal/GU negative Renal ROS  negative genitourinary   Musculoskeletal  (+) Arthritis , Osteoarthritis,  Fibromyalgia -  Abdominal   Peds  Hematology negative hematology ROS (+)   Anesthesia Other Findings   Reproductive/Obstetrics negative OB ROS                             Anesthesia Physical Anesthesia Plan  ASA: III  Anesthesia Plan: Spinal and MAC   Post-op Pain Management:  Regional for Post-op pain   Induction: Intravenous  PONV Risk Score and Plan: 2 and Propofol infusion, Ondansetron, Dexamethasone and Midazolam  Airway Management Planned: Simple Face Mask  Additional Equipment:   Intra-op Plan:   Post-operative Plan:   Informed Consent: I have reviewed the patients History and Physical, chart, labs and discussed the procedure including the risks, benefits and alternatives for the proposed anesthesia with the patient or authorized representative who has indicated his/her understanding and acceptance.     Dental advisory given  Plan Discussed with: CRNA  Anesthesia Plan Comments: (See PAT note written 12/30/2019 by Myra Gianotti, PA-C. She has preoperative cardiology  (Dr. Otho Perl) and pulmonology input (Dr. Alcide Clever). Preoperative EKG reviewed with Dr. Otho Perl. Recommendations per Dr. Alcide Clever: "I think that the patient should be given a dose of solumedrol 180m IV a few hours before surgery and should also be given a nebulizer treatment an hour before and also after anesthesia.  Patient should be on the lowest oxygen (post surgery) to keep the pulseox between 90-92%.  Patient may need CPAP after anesthesia.")       Anesthesia Quick Evaluation

## 2019-12-30 NOTE — H&P (Signed)
TOTAL KNEE ADMISSION H&P  Patient is being admitted for left total knee arthroplasty.  Subjective: 54 year old white female history of end-stage DJD left knee and pain comes in for preop evaluation.  States that knee symptoms unchanged from previous visit.  She is wanting to proceed with left total knee replacement as scheduled December 31, 2019.  We have received preop cardiac clearance from Dr. Judithe Modest.  Patient is scheduled to see pulmonologist for clearance March 1.  Patient admits to having a cough, chronic exertional chest pain and shortness of breath.  Admits orthopnea and states that she is a side sleeper.  History of sleep apnea but has chosen not to use CPAP.  In the evening she uses home O2 2 L nasal cannula. Chief Complaint:left knee pain.  HPI: Lydia [name], 54 y.o. female, has a history of pain and functional disability in the left knee due to arthritis and has failed non-surgical conservative treatments for greater than 12 weeks to includeNSAID's and/or analgesics, corticosteriod injections, supervised PT with diminished ADL's post treatment and activity modification.  Onset of symptoms was gradual, starting 10 years ago with gradually worsening course since that time.   Patient currently rates pain in the left knee(s) at 10 out of 10 with activity. Patient has night pain, worsening of pain with activity and weight bearing, pain that interferes with activities of daily living, crepitus and joint swelling.  Patient has evidence of subchondral sclerosis, periarticular osteophytes and joint space narrowing by imaging studies.  There is no active infection.  Patient Active Problem List   Diagnosis Date Noted  . Unilateral primary osteoarthritis, left knee 12/16/2019  . CAD (coronary artery disease) 02/01/2016  . Sepsis (HCC) 01/29/2016  . Respiratory failure (HCC) 01/29/2016  . Influenza   . Chronic obstructive pulmonary disease with acute exacerbation (HCC)   . Cough    Past Medical  History:  Diagnosis Date  . Anginal pain (HCC)    per patient  . Anxiety    per patient   . CHF (congestive heart failure) (HCC)   . COPD (chronic obstructive pulmonary disease) (HCC)   . Coronary artery disease   . Depression   . Dyspnea    per patient due to COPD, and with walking long distances per patient  . Fibromyalgia   . History of hiatal hernia   . Mitral regurgitation    Moderate MR 03/2019  . On supplemental oxygen by nasal cannula    per patient 2L at night  . Pneumonia   . Rheumatoid arthritis (HCC)    per patient diagnosed within the past 2 years, stated on 12/24/2019  . Sleep apnea    per patient diagnosed about 3-4 years ago and does not use a CPAP at night    Past Surgical History:  Procedure Laterality Date  . ABDOMINAL HYSTERECTOMY    . CARDIAC CATHETERIZATION    . CORONARY ANGIOPLASTY     DES RCA x3 01/05/16  . KNEE SURGERY Left   . TONSILLECTOMY      Current Facility-Administered Medications  Medication Dose Route Frequency Provider Last Rate Last Admin  . [START ON 12/31/2019] bupivacaine liposome (EXPAREL) 1.3 % injection 266 mg  20 mL Infiltration Once Eldred Manges, MD       Current Outpatient Medications  Medication Sig Dispense Refill Last Dose  . albuterol (PROVENTIL HFA;VENTOLIN HFA) 108 (90 Base) MCG/ACT inhaler Inhale 2 puffs into the lungs every 6 (six) hours as needed for wheezing or shortness of breath.      Marland Kitchen  benztropine (COGENTIN) 0.5 MG tablet Take 0.5 mg by mouth 2 (two) times daily.      . Buprenorphine HCl-Naloxone HCl 8-2 MG FILM Place 1 Film under the tongue in the morning and at bedtime.      Marland Kitchen buPROPion (WELLBUTRIN) 100 MG tablet Take 100 mg by mouth daily.     . cetirizine (ZYRTEC) 10 MG tablet Take 10 mg by mouth at bedtime.     . cyclobenzaprine (FLEXERIL) 10 MG tablet Take 10 mg by mouth 3 (three) times daily as needed for muscle spasms.     Marland Kitchen ezetimibe (ZETIA) 10 MG tablet Take 10 mg by mouth daily.     . fenofibrate (TRICOR)  145 MG tablet Take 145 mg by mouth daily.      . fluticasone (FLONASE) 50 MCG/ACT nasal spray Place 2 sprays into both nostrils daily.     . folic acid (FOLVITE) 1 MG tablet Take 1 mg by mouth daily.     . hydrochlorothiazide (HYDRODIURIL) 25 MG tablet Take 25 mg by mouth daily.      . isosorbide mononitrate (IMDUR) 60 MG 24 hr tablet Take 60 mg by mouth daily.     . magnesium oxide (MAG-OX) 400 MG tablet Take 400 mg by mouth 2 (two) times daily.      . montelukast (SINGULAIR) 10 MG tablet Take 10 mg by mouth at bedtime.     . OXYGEN Inhale 2 L into the lungs at bedtime.     . pantoprazole (PROTONIX) 40 MG tablet Take 40 mg by mouth daily.     . potassium chloride SA (KLOR-CON M20) 20 MEQ tablet Take 20 mEq by mouth 2 (two) times daily.      . risperiDONE (RISPERDAL) 3 MG tablet Take 3 mg by mouth at bedtime.      . solifenacin (VESICARE) 10 MG tablet Take 10 mg by mouth daily.     Marland Kitchen topiramate (TOPAMAX) 50 MG tablet Take 50 mg by mouth at bedtime.      . TOVIAZ 8 MG TB24 tablet Take 8 mg by mouth daily.     Marland Kitchen venlafaxine XR (EFFEXOR-XR) 150 MG 24 hr capsule Take 300 mg by mouth daily with breakfast.      . Vitamin D, Ergocalciferol, (DRISDOL) 1.25 MG (50000 UNIT) CAPS capsule Take 50,000 Units by mouth every 30 (thirty) days.     Marland Kitchen HYDROcodone-acetaminophen (NORCO) 5-325 MG tablet Take 1 tablet by mouth every 6 (six) hours as needed for moderate pain. 20 tablet 0   . ibuprofen (ADVIL,MOTRIN) 600 MG tablet Take 1 tablet (600 mg total) by mouth every 6 (six) hours as needed. 30 tablet 0   . meloxicam (MOBIC) 7.5 MG tablet Take 1 tablet (7.5 mg total) by mouth daily. 30 tablet 0   . metoprolol tartrate (LOPRESSOR) 25 MG tablet Take 25 mg by mouth 2 (two) times daily.      . nitroGLYCERIN (NITROSTAT) 0.4 MG SL tablet Place 0.4 mg under the tongue every 5 (five) minutes as needed for chest pain.      Marland Kitchen oseltamivir (TAMIFLU) 75 MG capsule Take 1 capsule (75 mg total) by mouth 2 (two) times daily. 4  capsule 0   . predniSONE (DELTASONE) 10 MG tablet Please take 40 mg oral daily  for 3 days, then 30 mg oral daily for 3 days, then 20 mg oral daily for 3 days, then 10 mg oral daily for 3 days then stop. 30 tablet 0  Allergies  Allergen Reactions  . Nsaids Nausea Only and Other (See Comments)    Heartburn    Social History   Tobacco Use  . Smoking status: Current Every Day Smoker    Packs/day: 1.00    Types: Cigarettes  . Smokeless tobacco: Never Used  Substance Use Topics  . Alcohol use: No    Family History  Problem Relation Age of Onset  . Cancer Father      Review of Systems  Constitutional: Positive for activity change.  Respiratory: Positive for cough and shortness of breath (exertional).   Cardiovascular: Positive for chest pain (exertional).  Gastrointestinal: Negative.   Musculoskeletal: Positive for gait problem.  Psychiatric/Behavioral: Negative.     Objective:  Physical Exam  Constitutional: She is oriented to person, place, and time. No distress.  HENT:  Head: Normocephalic and atraumatic.  Eyes: Pupils are equal, round, and reactive to light. EOM are normal.  Cardiovascular: Normal heart sounds.  Respiratory:  Breath sounds diminished.  Scattered rhonchi.   GI: Bowel sounds are normal. She exhibits no distension. There is no abdominal tenderness.  Musculoskeletal:        General: Tenderness present.  Neurological: She is alert and oriented to person, place, and time.  Skin: Skin is dry.  Psychiatric: She has a normal mood and affect.    Vital signs in last 24 hours:    Labs:   Estimated body mass index is 34.75 kg/m as calculated from the following:   Height as of 12/25/19: 5\' 2"  (1.575 m).   Weight as of 12/25/19: 86.2 kg.   Imaging Review Plain radiographs demonstrate moderate degenerative joint disease of the left knee(s). The overall alignment ismild varus. The bone quality appears to be good for age and reported activity  level.      Assessment/Plan:  End stage arthritis, left knee   The patient history, physical examination, clinical judgment of the provider and imaging studies are consistent with end stage degenerative joint disease of the left knee(s) and total knee arthroplasty is deemed medically necessary. The treatment options including medical management, injection therapy arthroscopy and arthroplasty were discussed at length. The risks and benefits of total knee arthroplasty were presented and reviewed. The risks due to aseptic loosening, infection, stiffness, patella tracking problems, thromboembolic complications and other imponderables were discussed. The patient acknowledged the explanation, agreed to proceed with the plan and consent was signed. Patient is being admitted for inpatient treatment for surgery, pain control, PT, OT, prophylactic antibiotics, VTE prophylaxis, progressive ambulation and ADL's and discharge planning. The patient is planning to be discharged home with home health services    Anticipated LOS equal to or greater than 2 midnights due to - Age 46 and older with one or more of the following:  - Obesity  - Expected need for hospital services (PT, OT, Nursing) required for safe  discharge  - Anticipated need for postoperative skilled nursing care or inpatient rehab  - Active co-morbidities: Chronic pain requiring opiods, Coronary Artery Disease and Heart Attack OR   - Unanticipated findings during/Post Surgery: None  - Patient is a high risk of re-admission due to: None

## 2019-12-31 ENCOUNTER — Encounter (HOSPITAL_COMMUNITY)
Admission: RE | Disposition: A | Discharge status: Home Health | Payer: Self-pay | Source: Home / Self Care | Attending: Orthopaedic Surgery

## 2019-12-31 ENCOUNTER — Inpatient Hospital Stay (HOSPITAL_COMMUNITY)
Admission: RE | Admit: 2019-12-31 | Discharge: 2020-01-02 | DRG: 470 | Disposition: A | Discharge status: Home Health | Payer: Medicare Other | Attending: Orthopaedic Surgery | Admitting: Orthopaedic Surgery

## 2019-12-31 ENCOUNTER — Other Ambulatory Visit: Payer: Self-pay

## 2019-12-31 ENCOUNTER — Observation Stay (HOSPITAL_COMMUNITY): Payer: Medicare Other

## 2019-12-31 ENCOUNTER — Ambulatory Visit (HOSPITAL_COMMUNITY): Payer: Medicare Other | Admitting: Vascular Surgery

## 2019-12-31 ENCOUNTER — Ambulatory Visit (HOSPITAL_COMMUNITY): Payer: Medicare Other | Admitting: Anesthesiology

## 2019-12-31 ENCOUNTER — Encounter (HOSPITAL_COMMUNITY): Payer: Self-pay | Admitting: Orthopaedic Surgery

## 2019-12-31 DIAGNOSIS — Y92234 Operating room of hospital as the place of occurrence of the external cause: Secondary | ICD-10-CM | POA: Diagnosis not present

## 2019-12-31 DIAGNOSIS — Z79899 Other long term (current) drug therapy: Secondary | ICD-10-CM

## 2019-12-31 DIAGNOSIS — M069 Rheumatoid arthritis, unspecified: Secondary | ICD-10-CM | POA: Diagnosis present

## 2019-12-31 DIAGNOSIS — Z96659 Presence of unspecified artificial knee joint: Secondary | ICD-10-CM

## 2019-12-31 DIAGNOSIS — K08109 Complete loss of teeth, unspecified cause, unspecified class: Secondary | ICD-10-CM | POA: Diagnosis present

## 2019-12-31 DIAGNOSIS — Z955 Presence of coronary angioplasty implant and graft: Secondary | ICD-10-CM

## 2019-12-31 DIAGNOSIS — Z9981 Dependence on supplemental oxygen: Secondary | ICD-10-CM

## 2019-12-31 DIAGNOSIS — Z888 Allergy status to other drugs, medicaments and biological substances status: Secondary | ICD-10-CM

## 2019-12-31 DIAGNOSIS — Z791 Long term (current) use of non-steroidal anti-inflammatories (NSAID): Secondary | ICD-10-CM

## 2019-12-31 DIAGNOSIS — I509 Heart failure, unspecified: Secondary | ICD-10-CM | POA: Diagnosis present

## 2019-12-31 DIAGNOSIS — G4730 Sleep apnea, unspecified: Secondary | ICD-10-CM | POA: Diagnosis present

## 2019-12-31 DIAGNOSIS — I2510 Atherosclerotic heart disease of native coronary artery without angina pectoris: Secondary | ICD-10-CM | POA: Diagnosis present

## 2019-12-31 DIAGNOSIS — F329 Major depressive disorder, single episode, unspecified: Secondary | ICD-10-CM | POA: Diagnosis present

## 2019-12-31 DIAGNOSIS — Z8701 Personal history of pneumonia (recurrent): Secondary | ICD-10-CM

## 2019-12-31 DIAGNOSIS — M797 Fibromyalgia: Secondary | ICD-10-CM | POA: Diagnosis present

## 2019-12-31 DIAGNOSIS — S72425A Nondisplaced fracture of lateral condyle of left femur, initial encounter for closed fracture: Secondary | ICD-10-CM | POA: Diagnosis not present

## 2019-12-31 DIAGNOSIS — F17210 Nicotine dependence, cigarettes, uncomplicated: Secondary | ICD-10-CM | POA: Diagnosis present

## 2019-12-31 DIAGNOSIS — J449 Chronic obstructive pulmonary disease, unspecified: Secondary | ICD-10-CM | POA: Diagnosis present

## 2019-12-31 DIAGNOSIS — F419 Anxiety disorder, unspecified: Secondary | ICD-10-CM | POA: Diagnosis present

## 2019-12-31 DIAGNOSIS — Z09 Encounter for follow-up examination after completed treatment for conditions other than malignant neoplasm: Secondary | ICD-10-CM

## 2019-12-31 DIAGNOSIS — M1712 Unilateral primary osteoarthritis, left knee: Principal | ICD-10-CM | POA: Diagnosis present

## 2019-12-31 DIAGNOSIS — Y838 Other surgical procedures as the cause of abnormal reaction of the patient, or of later complication, without mention of misadventure at the time of the procedure: Secondary | ICD-10-CM | POA: Diagnosis not present

## 2019-12-31 HISTORY — PX: TOTAL KNEE ARTHROPLASTY: SHX125

## 2019-12-31 SURGERY — ARTHROPLASTY, KNEE, TOTAL
Anesthesia: Monitor Anesthesia Care | Site: Knee | Laterality: Left

## 2019-12-31 MED ORDER — TRANEXAMIC ACID-NACL 1000-0.7 MG/100ML-% IV SOLN
INTRAVENOUS | Status: AC
  Filled 2019-12-31: qty 200

## 2019-12-31 MED ORDER — FENTANYL CITRATE (PF) 100 MCG/2ML IJ SOLN: 50.0000 ug | Freq: Once | INTRAMUSCULAR | Status: AC

## 2019-12-31 MED ORDER — HYDROMORPHONE HCL 1 MG/ML IJ SOLN
0.5000 mg | INTRAMUSCULAR | Status: DC | PRN
  Administered 2019-12-31 – 2020-01-01 (×5): 1 mg via INTRAVENOUS
  Administered 2020-01-02: 0.5 mg via INTRAVENOUS
  Filled 2019-12-31 (×7): qty 1

## 2019-12-31 MED ORDER — RISPERIDONE 3 MG PO TABS
3.0000 mg | ORAL_TABLET | Freq: Every day | ORAL | Status: DC
  Administered 2019-12-31 – 2020-01-01 (×2): 3 mg via ORAL
  Filled 2019-12-31 (×3): qty 1

## 2019-12-31 MED ORDER — MONTELUKAST SODIUM 10 MG PO TABS
10.0000 mg | ORAL_TABLET | Freq: Every day | ORAL | Status: DC
  Administered 2019-12-31 – 2020-01-01 (×2): 10 mg via ORAL
  Filled 2019-12-31 (×2): qty 1

## 2019-12-31 MED ORDER — HYDROCHLOROTHIAZIDE 25 MG PO TABS
25.0000 mg | ORAL_TABLET | Freq: Every day | ORAL | Status: DC
  Administered 2020-01-01 – 2020-01-02 (×2): 25 mg via ORAL
  Filled 2019-12-31 (×2): qty 1

## 2019-12-31 MED ORDER — FENOFIBRATE 54 MG PO TABS
54.0000 mg | ORAL_TABLET | Freq: Every day | ORAL | Status: DC
  Administered 2020-01-01 – 2020-01-02 (×2): 54 mg via ORAL
  Filled 2019-12-31 (×3): qty 1

## 2019-12-31 MED ORDER — ACETAMINOPHEN 325 MG PO TABS
325.0000 mg | ORAL_TABLET | Freq: Four times a day (QID) | ORAL | Status: DC | PRN
  Administered 2020-01-01: 650 mg via ORAL
  Filled 2019-12-31: qty 2

## 2019-12-31 MED ORDER — TOPIRAMATE 25 MG PO TABS
50.0000 mg | ORAL_TABLET | Freq: Every day | ORAL | Status: DC
  Administered 2019-12-31 – 2020-01-01 (×2): 50 mg via ORAL
  Filled 2019-12-31 (×2): qty 2

## 2019-12-31 MED ORDER — MENTHOL 3 MG MT LOZG: 1.0000 | LOZENGE | OROMUCOSAL | Status: DC | PRN

## 2019-12-31 MED ORDER — ACETAMINOPHEN 500 MG PO TABS
1000.0000 mg | ORAL_TABLET | Freq: Once | ORAL | Status: AC
  Administered 2019-12-31: 15:00:00 1000 mg via ORAL
  Filled 2019-12-31: qty 2

## 2019-12-31 MED ORDER — METOCLOPRAMIDE HCL 5 MG/ML IJ SOLN: 5.0000 mg | Freq: Three times a day (TID) | INTRAMUSCULAR | Status: DC | PRN

## 2019-12-31 MED ORDER — METOCLOPRAMIDE HCL 5 MG PO TABS: 5.0000 mg | ORAL_TABLET | Freq: Three times a day (TID) | ORAL | Status: DC | PRN

## 2019-12-31 MED ORDER — MIDAZOLAM HCL 2 MG/2ML IJ SOLN: 1.0000 mg | Freq: Once | INTRAMUSCULAR | Status: AC

## 2019-12-31 MED ORDER — OXYCODONE HCL 5 MG PO TABS
5.0000 mg | ORAL_TABLET | ORAL | Status: DC | PRN
  Administered 2019-12-31 – 2020-01-01 (×3): 5 mg via ORAL
  Filled 2019-12-31 (×3): qty 1

## 2019-12-31 MED ORDER — NITROGLYCERIN 0.4 MG SL SUBL: 0.4000 mg | SUBLINGUAL_TABLET | SUBLINGUAL | Status: DC | PRN

## 2019-12-31 MED ORDER — CHLORHEXIDINE GLUCONATE 4 % EX LIQD: 60.0000 mL | Freq: Once | CUTANEOUS | Status: DC

## 2019-12-31 MED ORDER — MAGNESIUM OXIDE 400 (241.3 MG) MG PO TABS
400.0000 mg | ORAL_TABLET | Freq: Two times a day (BID) | ORAL | Status: DC
  Administered 2019-12-31 – 2020-01-02 (×4): 400 mg via ORAL
  Filled 2019-12-31 (×6): qty 1

## 2019-12-31 MED ORDER — VITAMIN D (ERGOCALCIFEROL) 1.25 MG (50000 UNIT) PO CAPS
50000.0000 [IU] | ORAL_CAPSULE | ORAL | Status: DC
  Administered 2019-12-31: 50000 [IU] via ORAL
  Filled 2019-12-31: qty 1

## 2019-12-31 MED ORDER — MIDAZOLAM HCL 2 MG/2ML IJ SOLN
INTRAMUSCULAR | Status: AC
  Administered 2019-12-31: 1 mg via INTRAVENOUS
  Filled 2019-12-31: qty 2

## 2019-12-31 MED ORDER — CEFAZOLIN SODIUM-DEXTROSE 2-4 GM/100ML-% IV SOLN
2.0000 g | INTRAVENOUS | Status: AC
  Administered 2019-12-31: 16:00:00 2 g via INTRAVENOUS
  Filled 2019-12-31: qty 100

## 2019-12-31 MED ORDER — DARIFENACIN HYDROBROMIDE ER 15 MG PO TB24
15.0000 mg | ORAL_TABLET | Freq: Every day | ORAL | Status: DC
  Administered 2020-01-01 – 2020-01-02 (×2): 15 mg via ORAL
  Filled 2019-12-31 (×3): qty 1

## 2019-12-31 MED ORDER — FOLIC ACID 1 MG PO TABS
1.0000 mg | ORAL_TABLET | Freq: Every day | ORAL | Status: DC
  Administered 2020-01-01 – 2020-01-02 (×2): 1 mg via ORAL
  Filled 2019-12-31 (×2): qty 1

## 2019-12-31 MED ORDER — HYDROMORPHONE HCL 1 MG/ML IJ SOLN: 0.2500 mg | INTRAMUSCULAR | Status: DC | PRN

## 2019-12-31 MED ORDER — CEFAZOLIN SODIUM-DEXTROSE 2-4 GM/100ML-% IV SOLN
INTRAVENOUS | Status: AC
  Filled 2019-12-31: qty 100

## 2019-12-31 MED ORDER — BUPROPION HCL 100 MG PO TABS
100.0000 mg | ORAL_TABLET | Freq: Every day | ORAL | Status: DC
  Administered 2020-01-01 – 2020-01-02 (×2): 100 mg via ORAL
  Filled 2019-12-31 (×2): qty 1

## 2019-12-31 MED ORDER — FESOTERODINE FUMARATE ER 8 MG PO TB24
8.0000 mg | ORAL_TABLET | Freq: Every day | ORAL | Status: DC
  Administered 2020-01-01 – 2020-01-02 (×2): 8 mg via ORAL
  Filled 2019-12-31 (×3): qty 1

## 2019-12-31 MED ORDER — ALBUTEROL SULFATE (2.5 MG/3ML) 0.083% IN NEBU: 2.5000 mg | INHALATION_SOLUTION | Freq: Four times a day (QID) | RESPIRATORY_TRACT | Status: DC | PRN

## 2019-12-31 MED ORDER — SODIUM CHLORIDE 0.9 % IR SOLN
Status: DC | PRN
  Administered 2019-12-31: 3000 mL
  Administered 2019-12-31: 1000 mL

## 2019-12-31 MED ORDER — BUPIVACAINE LIPOSOME 1.3 % IJ SUSP
INTRAMUSCULAR | Status: DC | PRN
  Administered 2019-12-31: 20 mL

## 2019-12-31 MED ORDER — PANTOPRAZOLE SODIUM 40 MG PO TBEC
40.0000 mg | DELAYED_RELEASE_TABLET | Freq: Every day | ORAL | Status: DC
  Administered 2020-01-01 – 2020-01-02 (×2): 40 mg via ORAL
  Filled 2019-12-31 (×2): qty 1

## 2019-12-31 MED ORDER — PROPOFOL 500 MG/50ML IV EMUL
INTRAVENOUS | Status: DC | PRN
  Administered 2019-12-31: 100 ug/kg/min via INTRAVENOUS

## 2019-12-31 MED ORDER — ONDANSETRON HCL 4 MG/2ML IJ SOLN
4.0000 mg | Freq: Four times a day (QID) | INTRAMUSCULAR | Status: DC | PRN
  Administered 2019-12-31: 22:00:00 4 mg via INTRAVENOUS
  Filled 2019-12-31: qty 2

## 2019-12-31 MED ORDER — METHOCARBAMOL 1000 MG/10ML IJ SOLN
500.0000 mg | Freq: Four times a day (QID) | INTRAVENOUS | Status: DC | PRN
  Filled 2019-12-31: qty 5

## 2019-12-31 MED ORDER — LORATADINE 10 MG PO TABS
10.0000 mg | ORAL_TABLET | Freq: Every day | ORAL | Status: DC
  Administered 2020-01-01 – 2020-01-02 (×2): 10 mg via ORAL
  Filled 2019-12-31 (×2): qty 1

## 2019-12-31 MED ORDER — DOCUSATE SODIUM 100 MG PO CAPS
100.0000 mg | ORAL_CAPSULE | Freq: Two times a day (BID) | ORAL | Status: DC
  Administered 2019-12-31 – 2020-01-02 (×4): 100 mg via ORAL
  Filled 2019-12-31 (×4): qty 1

## 2019-12-31 MED ORDER — VENLAFAXINE HCL ER 150 MG PO CP24
300.0000 mg | ORAL_CAPSULE | Freq: Every day | ORAL | Status: DC
  Administered 2020-01-01 – 2020-01-02 (×2): 300 mg via ORAL
  Filled 2019-12-31 (×2): qty 2

## 2019-12-31 MED ORDER — PHENYLEPHRINE HCL-NACL 10-0.9 MG/250ML-% IV SOLN
INTRAVENOUS | Status: DC | PRN
  Administered 2019-12-31: 15 ug/min via INTRAVENOUS

## 2019-12-31 MED ORDER — BUPIVACAINE HCL (PF) 0.25 % IJ SOLN
INTRAMUSCULAR | Status: DC | PRN
  Administered 2019-12-31: 20 mL

## 2019-12-31 MED ORDER — BUPIVACAINE-EPINEPHRINE (PF) 0.5% -1:200000 IJ SOLN
INTRAMUSCULAR | Status: DC | PRN
  Administered 2019-12-31: 20 mL via PERINEURAL

## 2019-12-31 MED ORDER — FENTANYL CITRATE (PF) 100 MCG/2ML IJ SOLN
INTRAMUSCULAR | Status: AC
  Administered 2019-12-31: 14:00:00 50 ug via INTRAVENOUS
  Filled 2019-12-31: qty 2

## 2019-12-31 MED ORDER — EZETIMIBE 10 MG PO TABS
10.0000 mg | ORAL_TABLET | Freq: Every day | ORAL | Status: DC
  Administered 2020-01-01 – 2020-01-02 (×2): 10 mg via ORAL
  Filled 2019-12-31 (×2): qty 1

## 2019-12-31 MED ORDER — BENZTROPINE MESYLATE 0.5 MG PO TABS
0.5000 mg | ORAL_TABLET | Freq: Two times a day (BID) | ORAL | Status: DC
  Administered 2019-12-31 – 2020-01-02 (×4): 0.5 mg via ORAL
  Filled 2019-12-31 (×5): qty 1

## 2019-12-31 MED ORDER — TRANEXAMIC ACID-NACL 1000-0.7 MG/100ML-% IV SOLN
1000.0000 mg | INTRAVENOUS | Status: AC
  Administered 2019-12-31: 16:00:00 1000 mg via INTRAVENOUS

## 2019-12-31 MED ORDER — PHENOL 1.4 % MT LIQD: 1.0000 | OROMUCOSAL | Status: DC | PRN

## 2019-12-31 MED ORDER — BUPIVACAINE IN DEXTROSE 0.75-8.25 % IT SOLN
INTRATHECAL | Status: DC | PRN
  Administered 2019-12-31: 1.6 mL via INTRATHECAL

## 2019-12-31 MED ORDER — SODIUM CHLORIDE 0.9 % IV SOLN: INTRAVENOUS | Status: DC

## 2019-12-31 MED ORDER — ONDANSETRON HCL 4 MG/2ML IJ SOLN
INTRAMUSCULAR | Status: DC | PRN
  Administered 2019-12-31: 4 mg via INTRAVENOUS

## 2019-12-31 MED ORDER — ONDANSETRON HCL 4 MG PO TABS
4.0000 mg | ORAL_TABLET | Freq: Four times a day (QID) | ORAL | Status: DC | PRN
  Administered 2020-01-01 – 2020-01-02 (×2): 4 mg via ORAL
  Filled 2019-12-31 (×2): qty 1

## 2019-12-31 MED ORDER — ISOSORBIDE MONONITRATE ER 60 MG PO TB24
60.0000 mg | ORAL_TABLET | Freq: Every day | ORAL | Status: DC
  Administered 2020-01-01 – 2020-01-02 (×3): 60 mg via ORAL
  Filled 2019-12-31 (×2): qty 1

## 2019-12-31 MED ORDER — FLUTICASONE PROPIONATE 50 MCG/ACT NA SUSP
2.0000 | Freq: Every day | NASAL | Status: DC
  Administered 2020-01-01 – 2020-01-02 (×2): 2 via NASAL
  Filled 2019-12-31: qty 16

## 2019-12-31 MED ORDER — BUPIVACAINE HCL (PF) 0.25 % IJ SOLN
INTRAMUSCULAR | Status: AC
  Filled 2019-12-31: qty 30

## 2019-12-31 MED ORDER — LACTATED RINGERS IV SOLN: INTRAVENOUS | Status: DC

## 2019-12-31 MED ORDER — ASPIRIN EC 325 MG PO TBEC
325.0000 mg | DELAYED_RELEASE_TABLET | Freq: Every day | ORAL | Status: DC
  Administered 2020-01-01 – 2020-01-02 (×2): 325 mg via ORAL
  Filled 2019-12-31 (×2): qty 1

## 2019-12-31 MED ORDER — METHOCARBAMOL 500 MG PO TABS
500.0000 mg | ORAL_TABLET | Freq: Four times a day (QID) | ORAL | Status: DC | PRN
  Administered 2019-12-31 – 2020-01-02 (×6): 500 mg via ORAL
  Filled 2019-12-31 (×6): qty 1

## 2019-12-31 MED ORDER — POTASSIUM CHLORIDE CRYS ER 20 MEQ PO TBCR
20.0000 meq | EXTENDED_RELEASE_TABLET | Freq: Two times a day (BID) | ORAL | Status: DC
  Administered 2019-12-31 – 2020-01-02 (×4): 20 meq via ORAL
  Filled 2019-12-31 (×4): qty 1

## 2019-12-31 SURGICAL SUPPLY — 79 items
APL SKNCLS STERI-STRIP NONHPOA (GAUZE/BANDAGES/DRESSINGS)
ATTUNE PS FEM LT SZ 5 CEM KNEE (Femur) ×3 IMPLANT
ATTUNE PSRP INSR SZ5 5 KNEE (Insert) ×2 IMPLANT
ATTUNE PSRP INSR SZ5 5MM KNEE (Insert) ×1 IMPLANT
BANDAGE ESMARK 6X9 LF (GAUZE/BANDAGES/DRESSINGS) ×1 IMPLANT
BASE TIBIAL ROT PLAT SZ 5 KNEE (Knees) IMPLANT
BENZOIN TINCTURE PRP APPL 2/3 (GAUZE/BANDAGES/DRESSINGS) ×1 IMPLANT
BIT DRILL 2.9X70 QC CALB (BIT) ×2 IMPLANT
BLADE SAGITTAL 25.0X1.19X90 (BLADE) ×2 IMPLANT
BLADE SAGITTAL 25.0X1.19X90MM (BLADE) ×1
BLADE SAW SGTL 13X75X1.27 (BLADE) ×3 IMPLANT
BNDG CMPR 9X6 STRL LF SNTH (GAUZE/BANDAGES/DRESSINGS) ×1
BNDG ELASTIC 4X5.8 VLCR STR LF (GAUZE/BANDAGES/DRESSINGS) ×3 IMPLANT
BNDG ELASTIC 6X5.8 VLCR STR LF (GAUZE/BANDAGES/DRESSINGS) ×2 IMPLANT
BNDG ESMARK 6X9 LF (GAUZE/BANDAGES/DRESSINGS) ×3
BOWL SMART MIX CTS (DISPOSABLE) ×3 IMPLANT
BSPLAT TIB 5 CMNT ROT PLAT STR (Knees) ×1 IMPLANT
CEMENT HV SMART SET (Cement) ×6 IMPLANT
COVER SURGICAL LIGHT HANDLE (MISCELLANEOUS) ×3 IMPLANT
COVER WAND RF STERILE (DRAPES) IMPLANT
CUFF TOURN SGL QUICK 34 (TOURNIQUET CUFF) ×3
CUFF TOURN SGL QUICK 42 (TOURNIQUET CUFF) IMPLANT
CUFF TRNQT CYL 34X4.125X (TOURNIQUET CUFF) ×1 IMPLANT
DRAPE ORTHO SPLIT 77X108 STRL (DRAPES) ×6
DRAPE SURG ORHT 6 SPLT 77X108 (DRAPES) ×2 IMPLANT
DRAPE U-SHAPE 47X51 STRL (DRAPES) ×3 IMPLANT
DRSG AQUACEL AG ADV 3.5X14 (GAUZE/BANDAGES/DRESSINGS) ×2 IMPLANT
DRSG PAD ABDOMINAL 8X10 ST (GAUZE/BANDAGES/DRESSINGS) ×3 IMPLANT
DURAPREP 26ML APPLICATOR (WOUND CARE) ×6 IMPLANT
ELECT REM PT RETURN 9FT ADLT (ELECTROSURGICAL) ×3
ELECTRODE REM PT RTRN 9FT ADLT (ELECTROSURGICAL) ×1 IMPLANT
EVACUATOR 1/8 PVC DRAIN (DRAIN) IMPLANT
FACESHIELD WRAPAROUND (MASK) ×6 IMPLANT
FACESHIELD WRAPAROUND OR TEAM (MASK) ×2 IMPLANT
GAUZE SPONGE 4X4 12PLY STRL (GAUZE/BANDAGES/DRESSINGS) ×1 IMPLANT
GAUZE XEROFORM 5X9 LF (GAUZE/BANDAGES/DRESSINGS) ×1 IMPLANT
GLOVE BIOGEL PI IND STRL 8 (GLOVE) ×2 IMPLANT
GLOVE BIOGEL PI INDICATOR 8 (GLOVE) ×4
GLOVE ORTHO TXT STRL SZ7.5 (GLOVE) ×6 IMPLANT
GOWN STRL REUS W/ TWL LRG LVL3 (GOWN DISPOSABLE) ×1 IMPLANT
GOWN STRL REUS W/ TWL XL LVL3 (GOWN DISPOSABLE) ×1 IMPLANT
GOWN STRL REUS W/TWL 2XL LVL3 (GOWN DISPOSABLE) ×3 IMPLANT
GOWN STRL REUS W/TWL LRG LVL3 (GOWN DISPOSABLE) ×3
GOWN STRL REUS W/TWL XL LVL3 (GOWN DISPOSABLE) ×3
HANDPIECE INTERPULSE COAX TIP (DISPOSABLE) ×3
IMMOBILIZER KNEE 22 UNIV (SOFTGOODS) ×3 IMPLANT
KIT BASIN OR (CUSTOM PROCEDURE TRAY) ×3 IMPLANT
KIT TURNOVER KIT B (KITS) ×3 IMPLANT
MANIFOLD NEPTUNE II (INSTRUMENTS) ×3 IMPLANT
MARKER SKIN DUAL TIP RULER LAB (MISCELLANEOUS) ×3 IMPLANT
NEEDLE 18GX1X1/2 (RX/OR ONLY) (NEEDLE) ×3 IMPLANT
NEEDLE HYPO 25GX1X1/2 BEV (NEEDLE) ×3 IMPLANT
NS IRRIG 1000ML POUR BTL (IV SOLUTION) ×3 IMPLANT
PACK TOTAL JOINT (CUSTOM PROCEDURE TRAY) ×3 IMPLANT
PAD ARMBOARD 7.5X6 YLW CONV (MISCELLANEOUS) ×6 IMPLANT
PAD CAST 4YDX4 CTTN HI CHSV (CAST SUPPLIES) ×1 IMPLANT
PADDING CAST COTTON 4X4 STRL (CAST SUPPLIES) ×3
PADDING CAST COTTON 6X4 STRL (CAST SUPPLIES) ×1 IMPLANT
PATELLA MEDIAL ATTUN 35MM KNEE (Knees) ×2 IMPLANT
PIN STEINMAN FIXATION KNEE (PIN) ×3 IMPLANT
SCREW NLOCK CANC HEX 4X60 (Screw) ×3 IMPLANT
SCREW NLOCK DIST FEM 4X7 (Screw) ×2 IMPLANT
SET HNDPC FAN SPRY TIP SCT (DISPOSABLE) ×1 IMPLANT
STAPLER VISISTAT 35W (STAPLE) IMPLANT
SUCTION FRAZIER HANDLE 10FR (MISCELLANEOUS) ×3
SUCTION TUBE FRAZIER 10FR DISP (MISCELLANEOUS) ×1 IMPLANT
SUT VIC AB 0 CT1 27 (SUTURE) ×3
SUT VIC AB 0 CT1 27XBRD ANBCTR (SUTURE) ×1 IMPLANT
SUT VIC AB 1 CTX 36 (SUTURE) ×6
SUT VIC AB 1 CTX36XBRD ANBCTR (SUTURE) ×2 IMPLANT
SUT VIC AB 2-0 CT1 27 (SUTURE) ×6
SUT VIC AB 2-0 CT1 TAPERPNT 27 (SUTURE) ×2 IMPLANT
SUT VIC AB 3-0 X1 27 (SUTURE) ×3 IMPLANT
SYR 50ML LL SCALE MARK (SYRINGE) ×3 IMPLANT
SYR CONTROL 10ML LL (SYRINGE) ×3 IMPLANT
TIBIAL BASE ROT PLAT SZ 5 KNEE (Knees) ×3 IMPLANT
TOWEL GREEN STERILE (TOWEL DISPOSABLE) ×3 IMPLANT
TOWEL GREEN STERILE FF (TOWEL DISPOSABLE) ×3 IMPLANT
TRAY CATH 16FR W/PLASTIC CATH (SET/KITS/TRAYS/PACK) IMPLANT

## 2019-12-31 NOTE — Anesthesia Procedure Notes (Signed)
Anesthesia Regional Block: Adductor canal block   Pre-Anesthetic Checklist: ,, timeout performed, Correct Patient, Correct Site, Correct Laterality, Correct Procedure, Correct Position, site marked, Risks and benefits discussed, pre-op evaluation,  At surgeon's request and post-op pain management  Laterality: Left  Prep: Maximum Sterile Barrier Precautions used, chloraprep       Needles:  Injection technique: Single-shot  Needle Type: Echogenic Stimulator Needle     Needle Length: 9cm  Needle Gauge: 21     Additional Needles:   Procedures:,,,, ultrasound used (permanent image in chart),,,,  Narrative:  Start time: 12/31/2019 2:23 PM End time: 12/31/2019 2:33 PM Injection made incrementally with aspirations every 5 mL.  Performed by: Personally  Anesthesiologist: Gaynelle Adu, MD  Additional Notes: 2% Lidocaine skin wheel.

## 2019-12-31 NOTE — Op Note (Addendum)
Preop diagnosis: Left knee primary osteoarthritis  Postop diagnosis: Same  Procedure: Left total knee arthroplasty.  Surgeon: Lydia Greening, MD  Assistant: Zonia Kief, PA-C medically necessary and present for the entire procedure  Anesthesia preoperative block plus spinal +20 cc Exparel and 20 cc Marcaine prior to closure.  Drains: None  Complications.  Medial femoral condyle fracture occurred during trial impaction fixed with 2 bicortical screws 60 and 70 mm.  Tourniquet: 1 hour x 300 mm.  Implants: Depuy Attune #5 tibia #5 femur.  5 mm rotating platform.  35 mm 3 peg patella.  Biomet cortical screw 60 and 70 mm.  4-0 cortical.   Procedure: After standard prepping and draping proximal thigh tourniquet heel bump lateral post preoperative Ancef prophylaxis spinal anesthesia leg was prepped with DuraPrep the tip of the toes.  Usual extremity sheets drapes and.  Impervious stockinette and Coban was applied sterile skin marker followed by Betadine Steri-Drape sealing the skin.  Timeout procedure was completed.  Leg was wrapped with an Esmarch tourniquet inflated.  Midline incision was made medial parapatellar incision was made patella was everted there were large spurs on the patella 1 cm spurs on the femur and tibia with tricompartmental severe arthritis with exposed subchondral bone.  Overhanging spurs were removed meniscal remnants were resected ACL PCL was resected.  Intramedullary hole was made in the femur after the patella was cut resecting 10 mm with an oscillating saw from facet facet.  Trial sizing with 10 mm resected from the tibia and 10 on the distal femur.  Initially 5 mm spacer block was a little bit tight so we came back and took off an additional 2 mm off of the tibia which allowed full extension.  Bone was noted to be soft chamfer cuts were made on the femur and we waited do the box cut until the tibia had been cut and keel preparation made and #5 trial placed in the tibia and then  went back to the box cut on the femur.  Trial was inserted was offset by a millimeter it looks like it was not coming down in the box the trial was removed we used the oscillating saw and blade take little bit more more bone in the box.  Three-quarter curved osteotome was used to remove posterior spurs off the medial and lateral femoral condyle.  With impaction of the trial it was still off a millimeter as it was being settled down crack heard on the lateral femoral condyle.  Screw set was open and 240 cortical screws were placed 1 anteriorly with care taken to make sure that bicortical and would not be in the way of lug nuts or the femoral prosthesis.  Second was angled from distal to proximal across catching the opposite cortex which was a 70 mm screw.  Screws were tightened down to stabilize the nondisplaced crack in the condyle and then try with sit down completely.  Permanent prosthesis was opened.  SPECT mixing of the cement.  Tibia was cemented first excess cement was removed.  Femoral component was cemented down progressing gently down to make sure that the medial femoral condyle state exactly anatomic.  Prosthesis on the femoral side was flush on both sides with no gaps.  5 mm rotating platform polywas inserted.  Patella was prepared in place with the patellar clamp.  Cement was hard at .  While cement was setting up Exparel and Marcaine was injected tourniquet is deflated hemostasis obtained in standard layered closure with #1  Vicryl 2-0 Vicryl skin staple closure postop dressing and knee immobilizer.

## 2019-12-31 NOTE — Plan of Care (Signed)
  Problem: Education: Goal: Knowledge of General Education information will improve Description: Including pain rating scale, medication(s)/side effects and non-pharmacologic comfort measures Outcome: Progressing   Problem: Pain Managment: Goal: General experience of comfort will improve Outcome: Progressing   Problem: Safety: Goal: Ability to remain free from injury will improve Outcome: Progressing   

## 2019-12-31 NOTE — Anesthesia Procedure Notes (Signed)
Procedure Name: MAC Date/Time: 12/31/2019 3:50 PM Performed by: Imagene Riches, CRNA Pre-anesthesia Checklist: Patient identified, Emergency Drugs available, Suction available, Patient being monitored and Timeout performed Patient Re-evaluated:Patient Re-evaluated prior to induction Oxygen Delivery Method: Simple face mask

## 2019-12-31 NOTE — Anesthesia Procedure Notes (Signed)
Spinal  Patient location during procedure: OR Start time: 12/31/2019 3:37 PM End time: 12/31/2019 3:41 PM Staffing Performed: anesthesiologist  Anesthesiologist: Gaynelle Adu, MD Preanesthetic Checklist Completed: patient identified, IV checked, risks and benefits discussed, surgical consent, monitors and equipment checked, pre-op evaluation and timeout performed Spinal Block Patient position: sitting Prep: DuraPrep Patient monitoring: cardiac monitor, continuous pulse ox and blood pressure Approach: midline Location: L3-4 Injection technique: single-shot Needle Needle type: Pencan  Needle gauge: 24 G Needle length: 9 cm Assessment Sensory level: T8 Additional Notes Functioning IV was confirmed and monitors were applied. Sterile prep and drape, including hand hygiene and sterile gloves were used. The patient was positioned and the spine was prepped. The skin was anesthetized with lidocaine.  Free flow of clear CSF was obtained prior to injecting local anesthetic into the CSF.  The spinal needle aspirated freely following injection.  The needle was carefully withdrawn.  The patient tolerated the procedure well.

## 2019-12-31 NOTE — Progress Notes (Signed)
Orthopedic Tech Progress Note Patient Details:  Lydia [name] 1966-06-26 [id number]  CPM Left Knee CPM Left Knee: On Left Knee Flexion (Degrees): 90 Left Knee Extension (Degrees): 0 Additional Comments: TRAPEZE BAR  Post Interventions Patient Tolerated: Well Instructions Provided: Care of device  Saul Fordyce 12/31/2019, 6:20 PM

## 2019-12-31 NOTE — Interval H&P Note (Signed)
History and Physical Interval Note:  12/31/2019 2:21 PM  Lydia [name]  has presented today for surgery, with the diagnosis of left knee osteoarthritis.  The various methods of treatment have been discussed with the patient and family. After consideration of risks, benefits and other options for treatment, the patient has consented to  Procedure(s): LEFT TOTAL KNEE ARTHROPLASTY (Left) as a surgical intervention.  The patient's history has been reviewed, patient examined, no change in status, stable for surgery.  I have reviewed the patient's chart and labs.  Questions were answered to the patient's satisfaction.     Eldred Manges

## 2019-12-31 NOTE — Transfer of Care (Signed)
Immediate Anesthesia Transfer of Care Note  Patient: Lydia [name]  Procedure(s) Performed: LEFT TOTAL KNEE ARTHROPLASTY (Left Knee)  Patient Location: PACU  Anesthesia Type:MAC combined with regional for post-op pain  Level of Consciousness: awake, alert  and oriented  Airway & Oxygen Therapy: Patient Spontanous Breathing and Patient connected to face mask oxygen  Post-op Assessment: Report given to RN and Post -op Vital signs reviewed and stable  Post vital signs: Reviewed and stable  Last Vitals:  Vitals Value Taken Time  BP 121/93 12/31/19 1745  Temp    Pulse 70 12/31/19 1747  Resp 16 12/31/19 1747  SpO2 93 % 12/31/19 1747  Vitals shown include unvalidated device data.  Last Pain:  Vitals:   12/31/19 1430  TempSrc:   PainSc: 1       Patients Stated Pain Goal: 0 (82/06/01 5615)  Complications: No apparent anesthesia complications

## 2020-01-01 ENCOUNTER — Encounter: Payer: Self-pay | Admitting: *Deleted

## 2020-01-01 DIAGNOSIS — I2510 Atherosclerotic heart disease of native coronary artery without angina pectoris: Secondary | ICD-10-CM | POA: Diagnosis not present

## 2020-01-01 DIAGNOSIS — Z8701 Personal history of pneumonia (recurrent): Secondary | ICD-10-CM | POA: Diagnosis not present

## 2020-01-01 DIAGNOSIS — F329 Major depressive disorder, single episode, unspecified: Secondary | ICD-10-CM | POA: Diagnosis not present

## 2020-01-01 DIAGNOSIS — F419 Anxiety disorder, unspecified: Secondary | ICD-10-CM | POA: Diagnosis not present

## 2020-01-01 DIAGNOSIS — M797 Fibromyalgia: Secondary | ICD-10-CM | POA: Diagnosis not present

## 2020-01-01 DIAGNOSIS — Z96659 Presence of unspecified artificial knee joint: Secondary | ICD-10-CM

## 2020-01-01 DIAGNOSIS — F17210 Nicotine dependence, cigarettes, uncomplicated: Secondary | ICD-10-CM | POA: Diagnosis not present

## 2020-01-01 DIAGNOSIS — S72425A Nondisplaced fracture of lateral condyle of left femur, initial encounter for closed fracture: Secondary | ICD-10-CM | POA: Diagnosis not present

## 2020-01-01 DIAGNOSIS — Y838 Other surgical procedures as the cause of abnormal reaction of the patient, or of later complication, without mention of misadventure at the time of the procedure: Secondary | ICD-10-CM | POA: Diagnosis not present

## 2020-01-01 DIAGNOSIS — Y92234 Operating room of hospital as the place of occurrence of the external cause: Secondary | ICD-10-CM | POA: Diagnosis not present

## 2020-01-01 DIAGNOSIS — M1712 Unilateral primary osteoarthritis, left knee: Secondary | ICD-10-CM | POA: Diagnosis not present

## 2020-01-01 DIAGNOSIS — Z955 Presence of coronary angioplasty implant and graft: Secondary | ICD-10-CM | POA: Diagnosis not present

## 2020-01-01 DIAGNOSIS — K08109 Complete loss of teeth, unspecified cause, unspecified class: Secondary | ICD-10-CM | POA: Diagnosis not present

## 2020-01-01 DIAGNOSIS — Z791 Long term (current) use of non-steroidal anti-inflammatories (NSAID): Secondary | ICD-10-CM | POA: Diagnosis not present

## 2020-01-01 DIAGNOSIS — Z79899 Other long term (current) drug therapy: Secondary | ICD-10-CM | POA: Diagnosis not present

## 2020-01-01 DIAGNOSIS — G4730 Sleep apnea, unspecified: Secondary | ICD-10-CM | POA: Diagnosis not present

## 2020-01-01 DIAGNOSIS — M069 Rheumatoid arthritis, unspecified: Secondary | ICD-10-CM | POA: Diagnosis not present

## 2020-01-01 DIAGNOSIS — J449 Chronic obstructive pulmonary disease, unspecified: Secondary | ICD-10-CM | POA: Diagnosis not present

## 2020-01-01 DIAGNOSIS — Z9981 Dependence on supplemental oxygen: Secondary | ICD-10-CM | POA: Diagnosis not present

## 2020-01-01 DIAGNOSIS — Z888 Allergy status to other drugs, medicaments and biological substances status: Secondary | ICD-10-CM | POA: Diagnosis not present

## 2020-01-01 DIAGNOSIS — I509 Heart failure, unspecified: Secondary | ICD-10-CM | POA: Diagnosis not present

## 2020-01-01 LAB — CBC
HCT: 34.3 % — ABNORMAL LOW (ref 36.0–46.0)
Hemoglobin: 11 g/dL — ABNORMAL LOW (ref 12.0–15.0)
MCH: 31.2 pg (ref 26.0–34.0)
MCHC: 32.1 g/dL (ref 30.0–36.0)
MCV: 97.2 fL (ref 80.0–100.0)
Platelets: 282 10*3/uL (ref 150–400)
RBC: 3.53 MIL/uL — ABNORMAL LOW (ref 3.87–5.11)
RDW: 14 % (ref 11.5–15.5)
WBC: 8.1 10*3/uL (ref 4.0–10.5)
nRBC: 0 % (ref 0.0–0.2)

## 2020-01-01 LAB — BASIC METABOLIC PANEL
Anion gap: 11 (ref 5–15)
BUN: 8 mg/dL (ref 6–20)
CO2: 25 mmol/L (ref 22–32)
Calcium: 8.7 mg/dL — ABNORMAL LOW (ref 8.9–10.3)
Chloride: 100 mmol/L (ref 98–111)
Creatinine, Ser: 1.01 mg/dL — ABNORMAL HIGH (ref 0.44–1.00)
GFR calc Af Amer: >60 mL/min (ref >60)
GFR calc non Af Amer: >60 mL/min (ref >60)
Glucose, Bld: 149 mg/dL — ABNORMAL HIGH (ref 70–99)
Potassium: 3.5 mmol/L (ref 3.5–5.1)
Sodium: 136 mmol/L (ref 135–145)

## 2020-01-01 MED ORDER — OXYCODONE HCL 5 MG PO TABS
10.0000 mg | ORAL_TABLET | ORAL | Status: DC | PRN
  Administered 2020-01-01 – 2020-01-02 (×8): 10 mg via ORAL
  Filled 2020-01-01 (×8): qty 2

## 2020-01-01 NOTE — Anesthesia Postprocedure Evaluation (Signed)
Anesthesia Post Note  Patient: TALISA PETRAK  Procedure(s) Performed: LEFT TOTAL KNEE ARTHROPLASTY (Left Knee)     Patient location during evaluation: PACU Anesthesia Type: Spinal Level of consciousness: oriented and awake and alert Pain management: pain level controlled Vital Signs Assessment: post-procedure vital signs reviewed and stable Respiratory status: spontaneous breathing, respiratory function stable and patient connected to nasal cannula oxygen Cardiovascular status: blood pressure returned to baseline and stable Postop Assessment: no headache, no backache and no apparent nausea or vomiting Anesthetic complications: no    Last Vitals:  Vitals:   01/01/20 0330 01/01/20 0744  BP: 136/83 133/80  Pulse: (!) 102 (!) 104  Resp: 18   Temp: 37.1 C 36.8 C  SpO2: 96% 93%    Last Pain:  Vitals:   01/01/20 0744  TempSrc: Oral  PainSc:                  Hasset Chaviano S

## 2020-01-01 NOTE — Progress Notes (Signed)
Was up in chair and back to bed with pain a 10

## 2020-01-01 NOTE — Progress Notes (Signed)
Orthopedic Tech Progress Note Patient Details:  Lydia [name] 1966-06-15 [id number]  CPM Left Knee CPM Left Knee: On Left Knee Flexion (Degrees): 65 Left Knee Extension (Degrees): 0 Additional Comments: patient raised up out of bed when put on 90 degrees  Post Interventions Patient Tolerated: Well Instructions Provided: Care of device  [name] 01/01/2020, 7:52 PM

## 2020-01-01 NOTE — Evaluation (Signed)
Physical Therapy Evaluation Patient Details Name: Lydia [name] MRN: [id number] DOB: 17-Dec-1965 Today's Date: 01/01/2020   History of Present Illness  Patient is 54 y.o. female s/p Lt TKA on 12/31/19 with PMH significant for OA, RA, fibromyalgia, depression, COPD, CHF, anxiety, CAD, dyspnea.    Clinical Impression  Lydia [name] is a 54 y.o. female POD 1 s/p Lt TKA. Patient reports modified independence with rollator for mobility at baseline. Patient is now limited by functional impairments (see PT problem list below) and requires min-mod for transfers and gait with RW. Patient was able to ambulate ~8 feet with RW and min assist for walker management. Patient was limited by pain this session and knee immobilizer required due to poor Lt quad activation. Patient instructed in exercise to facilitate circulation. Patient will benefit from continued skilled PT interventions to address impairments and progress towards PLOF. Acute PT will follow to progress mobility and stair training in preparation for safe discharge home.     Follow Up Recommendations Follow surgeon's recommendation for DC plan and follow-up therapies(pt may require SNF level rehab for therapy if she does not progress in acute setting.)    Equipment Recommendations  Rolling walker with 5" wheels    Recommendations for Other Services       Precautions / Restrictions Precautions Precautions: Fall Restrictions Weight Bearing Restrictions: No LLE Weight Bearing: Weight bearing as tolerated      Mobility  Bed Mobility Overal bed mobility: Needs Assistance Bed Mobility: Supine to Sit     Supine to sit: HOB elevated;Mod assist     General bed mobility comments: cues for sequencing use of bed rails to pivot to sit EOB, assist with Lt LE mobility and to raise trunk upright  Transfers Overall transfer level: Needs assistance Equipment used: Rolling walker (2 wheeled) Transfers: Sit to/from Stand Sit to Stand: From elevated  surface;Min assist         General transfer comment: cues for hand placement and technique with RW for transfer, assist required to rise. pt avoiding weight bearing on Lt LE initially.  Ambulation/Gait Ambulation/Gait assistance: Min assist Gait Distance (Feet): 8 Feet Assistive device: Rolling walker (2 wheeled) Gait Pattern/deviations: Step-to pattern;Decreased stride length;Decreased stance time - left;Decreased weight shift to left Gait velocity: decreased   General Gait Details: verbal cues for sequencing step pattern with RW and for precautions of WBAT on Lt LE. knee immobilizer used for safety due to poor Lt quad activation. Pt able to take several short steps to recliner, assist for walker positioning reqruired.  Stairs            Wheelchair Mobility    Modified Rankin (Stroke Patients Only)       Balance Overall balance assessment: Needs assistance Sitting-balance support: Feet supported Sitting balance-Leahy Scale: Fair     Standing balance support: During functional activity;Bilateral upper extremity supported Standing balance-Leahy Scale: Poor            Pertinent Vitals/Pain Pain Assessment: 0-10 Pain Score: 10-Worst pain ever Pain Location: Lt knee Pain Descriptors / Indicators: Aching;Discomfort;Sore;Grimacing;Guarding;Moaning Pain Intervention(s): Limited activity within patient's tolerance;Monitored during session;Repositioned    Home Living Family/patient expects to be discharged to:: Private residence Living Arrangements: Children Available Help at Discharge: Family;Available PRN/intermittently Type of Home: House Home Access: Stairs to enter;Level entry Entrance Stairs-Rails: Can reach both Entrance Stairs-Number of Steps: level entry into sunroom and 2 steps up into house from there (rails on both sides) Home Layout: One level Home Equipment: Dan Humphreys -  4 wheels Additional Comments: daughter works a as Animator from [employer]    Prior  Function Level of Independence: Independent with assistive device(s)         Comments: pt was using rollator for mobility prior to surgery. pt was still driving and doing her own food shopping and relied on a cart to lean on while walking in food store     Hand Dominance   Dominant Hand: Right    Extremity/Trunk Assessment   Upper Extremity Assessment Upper Extremity Assessment: Overall WFL for tasks assessed    Lower Extremity Assessment Lower Extremity Assessment: LLE deficits/detail LLE Deficits / Details: pt with poor quad activation and unable to perform SLR secondary to pain and weakness; knee immobilizer used for mobility LLE: Unable to fully assess due to immobilization;Unable to fully assess due to pain    Cervical / Trunk Assessment Cervical / Trunk Assessment: Normal  Communication   Communication: No difficulties  Cognition Arousal/Alertness: Awake/alert Behavior During Therapy: Anxious Overall Cognitive Status: Within Functional Limits for tasks assessed             General Comments      Exercises Total Joint Exercises Ankle Circles/Pumps: AROM;Both;Seated;10 reps   Assessment/Plan    PT Assessment Patient needs continued PT services  PT Problem List Decreased strength;Decreased range of motion;Decreased mobility;Decreased balance;Decreased activity tolerance;Decreased knowledge of use of DME;Decreased knowledge of precautions       PT Treatment Interventions DME instruction;Functional mobility training;Balance training;Patient/family education;Gait training;Therapeutic activities;Therapeutic exercise;Stair training    PT Goals (Current goals can be found in the Care Plan section)  Acute Rehab PT Goals Patient Stated Goal: to not hurt and get home PT Goal Formulation: With patient Time For Goal Achievement: 01/08/20 Potential to Achieve Goals: Good    Frequency 7X/week   Barriers to discharge   pt lives with daughter who is working and is  not there from ~7am-3pm.       AM-PAC PT "6 Clicks" Mobility  Outcome Measure Help needed turning from your back to your side while in a flat bed without using bedrails?: A Little Help needed moving from lying on your back to sitting on the side of a flat bed without using bedrails?: A Lot Help needed moving to and from a bed to a chair (including a wheelchair)?: A Little Help needed standing up from a chair using your arms (e.g., wheelchair or bedside chair)?: A Little Help needed to walk in hospital room?: A Little Help needed climbing 3-5 steps with a railing? : A Lot 6 Click Score: 16    End of Session Equipment Utilized During Treatment: Gait belt Activity Tolerance: Patient limited by pain Patient left: in chair;with call bell/phone within reach;with chair alarm set Nurse Communication: Mobility status PT Visit Diagnosis: Muscle weakness (generalized) (M62.81);Difficulty in walking, not elsewhere classified (R26.2)    Time: 1010-1041 PT Time Calculation (min) (ACUTE ONLY): 31 min   Charges:   PT Evaluation $PT Eval Low Complexity: 1 Low PT Treatments $Therapeutic Activity: 8-22 mins       Verner Mould, DPT Physical Therapist with [facility] [phone]  01/01/2020 12:15 PM

## 2020-01-01 NOTE — Progress Notes (Signed)
   Subjective: 1 Day Post-Op Procedure(s) (LRB): LEFT TOTAL KNEE ARTHROPLASTY (Left) Patient reports pain as severe.    Objective: Vital signs in last 24 hours: Temp:  [98 F (36.7 C)-98.8 F (37.1 C)] 98.8 F (37.1 C) (03/04 0330) Pulse Rate:  [64-102] 102 (03/04 0330) Resp:  [16-24] 18 (03/04 0330) BP: (94-144)/(49-93) 136/83 (03/04 0330) SpO2:  [92 %-99 %] 96 % (03/04 0330) Weight:  [87.5 kg] 87.5 kg (03/03 1330)  Intake/Output from previous day: 03/03 0701 - 03/04 0700 In: 900 [I.V.:800; IV Piggyback:100] Out: 325 [Urine:300; Blood:25] Intake/Output this shift: No intake/output data recorded.  Recent Labs    01/01/20 0348  HGB 11.0*   Recent Labs    01/01/20 0348  WBC 8.1  RBC 3.53*  HCT 34.3*  PLT 282   Recent Labs    01/01/20 0348  NA 136  K 3.5  CL 100  CO2 25  BUN 8  CREATININE 1.01*  GLUCOSE 149*  CALCIUM 8.7*   No results for input(s): LABPT, INR in the last 72 hours.  Neurologically intact DG Knee 1-2 Views Left  Result Date: 12/31/2019 CLINICAL DATA:  54 year old female status post left knee arthroplasty. EXAM: LEFT KNEE - 1-2 VIEW COMPARISON:  Left knee radiograph dated 12/04/2019. FINDINGS: There is a total left knee arthroplasty. The arthroplasty components appear intact and in anatomic alignment. There is no acute fracture or dislocation. The bones are osteopenic. Small amount of air and fluid in the suprapatellar space, postoperative. Cutaneous clips of the anterior knee and soft tissue edema. IMPRESSION: Postsurgical changes of left knee arthroplasty. Electronically Signed   By: Elgie Collard M.D.   On: 12/31/2019 18:32    Assessment/Plan: 1 Day Post-Op Procedure(s) (LRB): LEFT TOTAL KNEE ARTHROPLASTY (Left) Up with therapy. Increased oxycodone to 10 mg from 5mg . Expect very slow moving.   01/01/2020, 7:32 AM

## 2020-01-01 NOTE — Progress Notes (Signed)
Physical Therapy Treatment Patient Details Name: Lydia [name] MRN: [id number] DOB: 06/29/1966 Today's Date: 01/01/2020    History of Present Illness Patient is 54 y.o. female s/p Lt TKA on 12/31/19 with PMH significant for OA, RA, fibromyalgia, depression, COPD, CHF, anxiety, CAD, dyspnea.    PT Comments    Patient continues to be limited by pain and poor Lt quad activation for functional mobility. She was instructed in exercises for ROM and circulation this session and required assist for ROM into flexion. Knee immobilizer remained donned for functional mobility for safety secondary to poor Lt quad strength. Pt increased ambulation distance to walk to bathroom and required min assist for walker management. She demonstrated good carryover for gait pattern in RW. Patient will continue to benefit from skilled PT interventions to address impairments and progress mobility. She would benefit from skilled therapy at SNF level due to limitations of pain and weakness as well as decreased caregiver support. Acute PT will progress as able.   Follow Up Recommendations  Follow surgeon's recommendation for DC plan and follow-up therapies;SNF(pt may require SNF level rehab for therapy if she does not progress in acute setting.)     Equipment Recommendations  Rolling walker with 5" wheels    Recommendations for Other Services       Precautions / Restrictions Precautions Precautions: Fall Restrictions Weight Bearing Restrictions: No LLE Weight Bearing: Weight bearing as tolerated    Mobility  Bed Mobility Overal bed mobility: Needs Assistance Bed Mobility: Supine to Sit;Sit to Supine     Supine to sit: HOB elevated;Min assist Sit to supine: Min assist;HOB elevated   General bed mobility comments: pt educated on use of gait belt to assist with Lt LE mobility. pt required less assist compared to AM session, cues needed for use of bed rail. Knee immobilizer remained in place due to ongoing poor  quad activation.  Transfers Overall transfer level: Needs assistance Equipment used: Rolling walker (2 wheeled) Transfers: Sit to/from Stand Sit to Stand: From elevated surface;Min assist      General transfer comment: pt wtih good carryover from AM session for safe hand placement to initiate power up from EOB to RW. min assist from elevated surface to complete power up. verbcal cues required for safe reach back to grab bar to control lowering to toilet. Min assist for power up from toielt with use of grab bar.  Ambulation/Gait Ambulation/Gait assistance: Min assist Gait Distance (Feet): 20 Feet(2x10) Assistive device: Rolling walker (2 wheeled) Gait Pattern/deviations: Step-to pattern;Decreased stride length;Decreased stance time - left;Decreased weight shift to left Gait velocity: decreased   General Gait Details: pt with good carryover for safe step to pattern and safe proximity to RW during gait to ambulate to bathroom. No overt LOB noted, min assist required for walker management in turns. pt resting on RA at 88% and 2L/min applied for mobility. Pt at 96% after gait and RN notified pt staying on 2L as she sometimes uses O2 at home.   Stairs        Wheelchair Mobility    Modified Rankin (Stroke Patients Only)       Balance Overall balance assessment: Needs assistance Sitting-balance support: Feet supported Sitting balance-Leahy Scale: Fair     Standing balance support: During functional activity;Bilateral upper extremity supported Standing balance-Leahy Scale: Poor           Cognition Arousal/Alertness: Awake/alert Behavior During Therapy: Anxious Overall Cognitive Status: Within Functional Limits for tasks assessed  General Comments: pt agreable to work with therapy, moaning and slightly tearful at times stating "it hurts".      Exercises Total Joint Exercises Ankle Circles/Pumps: Both;Supine;AROM;10 reps Quad Sets: AROM;10 reps;Left;Supine Short  Arc Quad: (pt unable to complete) Heel Slides: AAROM;10 reps;Supine;Left Hip ABduction/ADduction: AROM;10 reps;Supine;Left Straight Leg Raises: (pt unable to perform)    General Comments        Pertinent Vitals/Pain Pain Assessment: 0-10 Pain Score: 8  Pain Location: Lt knee Pain Descriptors / Indicators: Aching;Discomfort;Grimacing;Moaning;Crying Pain Intervention(s): Limited activity within patient's tolerance;Monitored during session;Repositioned           PT Goals (current goals can now be found in the care plan section) Acute Rehab PT Goals Patient Stated Goal: to not hurt and get home PT Goal Formulation: With patient Time For Goal Achievement: 01/08/20 Potential to Achieve Goals: Good Progress towards PT goals: Progressing toward goals    Frequency    7X/week      PT Plan Current plan remains appropriate       AM-PAC PT "6 Clicks" Mobility   Outcome Measure  Help needed turning from your back to your side while in a flat bed without using bedrails?: A Little Help needed moving from lying on your back to sitting on the side of a flat bed without using bedrails?: A Lot Help needed moving to and from a bed to a chair (including a wheelchair)?: A Little Help needed standing up from a chair using your arms (e.g., wheelchair or bedside chair)?: A Little Help needed to walk in hospital room?: A Little Help needed climbing 3-5 steps with a railing? : A Lot 6 Click Score: 16    End of Session Equipment Utilized During Treatment: Gait belt Activity Tolerance: Patient limited by pain Patient left: in chair;with call bell/phone within reach;with chair alarm set Nurse Communication: Mobility status PT Visit Diagnosis: Muscle weakness (generalized) (M62.81);Difficulty in walking, not elsewhere classified (R26.2)     Time: 1443-1530 PT Time Calculation (min) (ACUTE ONLY): 47 min  Charges:  $Gait Training: 8-22 mins $Therapeutic Exercise: 8-22 mins $Therapeutic  Activity: 8-22 mins                    Verner Mould, DPT Physical Therapist with [facility] [phone]  01/01/2020 5:30 PM

## 2020-01-01 NOTE — Plan of Care (Signed)
  Problem: Education: Goal: Knowledge of General Education information will improve Description: Including pain rating scale, medication(s)/side effects and non-pharmacologic comfort measures Outcome: Progressing   Problem: Health Behavior/Discharge Planning: Goal: Ability to manage health-related needs will improve Outcome: Progressing   Problem: Clinical Measurements: Goal: Cardiovascular complication will be avoided Outcome: Progressing   Problem: Activity: Goal: Risk for activity intolerance will decrease Outcome: Progressing   Problem: Nutrition: Goal: Adequate nutrition will be maintained Outcome: Progressing   Problem: Coping: Goal: Level of anxiety will decrease Outcome: Progressing   Problem: Pain Managment: Goal: General experience of comfort will improve Outcome: Progressing

## 2020-01-02 LAB — CBC
HCT: 29.9 % — ABNORMAL LOW (ref 36.0–46.0)
Hemoglobin: 9.7 g/dL — ABNORMAL LOW (ref 12.0–15.0)
MCH: 31.1 pg (ref 26.0–34.0)
MCHC: 32.4 g/dL (ref 30.0–36.0)
MCV: 95.8 fL (ref 80.0–100.0)
Platelets: 247 10*3/uL (ref 150–400)
RBC: 3.12 MIL/uL — ABNORMAL LOW (ref 3.87–5.11)
RDW: 13.6 % (ref 11.5–15.5)
WBC: 6.3 10*3/uL (ref 4.0–10.5)
nRBC: 0 % (ref 0.0–0.2)

## 2020-01-02 MED ORDER — ASPIRIN 325 MG PO TBEC: 325.0000 mg | DELAYED_RELEASE_TABLET | Freq: Every day | ORAL | 0 refills | Status: DC

## 2020-01-02 MED ORDER — OXYCODONE-ACETAMINOPHEN 5-325 MG PO TABS: 1.0000 | ORAL_TABLET | Freq: Four times a day (QID) | ORAL | 0 refills | Status: AC | PRN

## 2020-01-02 NOTE — Progress Notes (Signed)
Dr Ophelia Charter ordered a Bone Foam for this patient.  Ortho tech was notified and was told that they have ordered this item and waiting for it to arrive.  BONE FOAM HAS BEEN ORDERED BUT UNAVAILABLE AT THIS TIME

## 2020-01-02 NOTE — Discharge Instructions (Signed)

## 2020-01-02 NOTE — Progress Notes (Signed)
Talked with pt about recommendations for SNF , she has daughter at home and wants to be discharged today. HHPT has been ordered, no note by SS .   Order repeated. Will see pt in one week.

## 2020-01-02 NOTE — Plan of Care (Signed)
  Problem: Education: Goal: Knowledge of General Education information will improve Description Including pain rating scale, medication(s)/side effects and non-pharmacologic comfort measures Outcome: Progressing   Problem: Health Behavior/Discharge Planning: Goal: Ability to manage health-related needs will improve Outcome: Progressing   

## 2020-01-02 NOTE — Progress Notes (Signed)
   Subjective: 2 Days Post-Op Procedure(s) (LRB): LEFT TOTAL KNEE ARTHROPLASTY (Left) Patient reports pain as moderate and severe.  Taking pain meds every chance she can. Has been OOB to bathroom  Objective: Vital signs in last 24 hours: Temp:  [98.2 F (36.8 C)-99.7 F (37.6 C)] 99.7 F (37.6 C) (03/05 0743) Pulse Rate:  [96-127] 98 (03/05 0743) Resp:  [17-18] 18 (03/05 0743) BP: (121-144)/(73-91) 128/81 (03/05 0743) SpO2:  [91 %-96 %] 93 % (03/05 0743)  Intake/Output from previous day: 03/04 0701 - 03/05 0700 In: 1243.2 [P.O.:250; I.V.:993.2] Out: 800 [Urine:800] Intake/Output this shift: Total I/O In: 240 [P.O.:240] Out: -   Recent Labs    01/01/20 0348 01/02/20 0539  HGB 11.0* 9.7*   Recent Labs    01/01/20 0348 01/02/20 0539  WBC 8.1 6.3  RBC 3.53* 3.12*  HCT 34.3* 29.9*  PLT 282 247   Recent Labs    01/01/20 0348  NA 136  K 3.5  CL 100  CO2 25  BUN 8  CREATININE 1.01*  GLUCOSE 149*  CALCIUM 8.7*   No results for input(s): LABPT, INR in the last 72 hours.  Neurologically intact No results found.  Assessment/Plan: 2 Days Post-Op Procedure(s) (LRB): LEFT TOTAL KNEE ARTHROPLASTY (Left) Up with therapy, if she can make it to [facility] and ambulate in hall can be discharge late today.   Eldred Manges 01/02/2020, 10:00 AM

## 2020-01-02 NOTE — Progress Notes (Signed)
Physical Therapy Treatment Patient Details Name: Lydia [name] MRN: [id number] DOB: Dec 31, 1965 Today's Date: 01/02/2020    History of Present Illness Patient is 54 y.o. female s/p L TKA on 12/31/19 with PMH significant for OA, RA, fibromyalgia, depression, COPD, CHF, anxiety, CAD, dyspnea.    PT Comments    Pt continues to demonstrate limited quad activation and knee ROM for functional mobility. Pt was able to complete quad sets and heel slides with assistance. Pt required assistance managing L LE for bed mobility and transfers for comfort. Pt demonstrated good carryover for RW safety and gait pattern. Occasional verbal cues required for hand placement during supine to sit and STS. Pt was able to ambulate ~85ft with RW and min guard due to nausea and apprehension.  She was able to place her L foot flat onto floor demonstrating inc WB on L LE. Pt was on 2L/min via [place] during session and was at 95% post-session. Pt able to tolerate entire session w/o inc pain but remains apprehensive during transfers and ambulation. She would benefit from skilled PT at SNF level due to limitations from pain and weakness and decreased caregiver support. Pt will continue to benefit from acute PT services while admitted and after d/c to address impairments to improve functional mobility, safety, and independence.   ROM: L LE - Knee Flex: 62deg - Knee Ext: lacking 20deg   Follow Up Recommendations  SNF;Other (comment)(if pt refuses will require HHPT and 24/7 supervision)     Equipment Recommendations  Rolling walker with 5" wheels    Recommendations for Other Services       Precautions / Restrictions Precautions Precautions: Fall;Knee Restrictions Weight Bearing Restrictions: Yes LLE Weight Bearing: Weight bearing as tolerated    Mobility  Bed Mobility Overal bed mobility: Needs Assistance Bed Mobility: Supine to Sit     Supine to sit: HOB elevated;Min assist     General bed mobility comments: inc  time and effort, B UE use of bedrail. Verbal cueing required to sequence supine to sit and raise trunk upright with use of 1 bed rail. Assistance needed for L LE movement off of bed..  Transfers Overall transfer level: Needs assistance Equipment used: Rolling walker (2 wheeled) Transfers: Sit to/from Stand Sit to Stand: Min assist         General transfer comment: Pt had good carryover from previous sessions for safe hand placement to initiate power up from EOB to RW with min assist to complete safe standing. Pt able to reach back with one hand and lower herself to seated at chair with control, inc time required for transfers   Ambulation/Gait Ambulation/Gait assistance: Min guard Gait Distance (Feet): 20 Feet Assistive device: Rolling walker (2 wheeled) Gait Pattern/deviations: Step-to pattern;Decreased stride length;Decreased stance time - left     General Gait Details: pt with good carryover for safe step to gait pattern and safe proximity to RW during ambulation. Min guard required d/t pts nauseousness and apprehensiveness. pt able to place her entire L foot on the ground during ambulation. 2L via [place] used during session, pt at 95% after gait.    Stairs             Wheelchair Mobility    Modified Rankin (Stroke Patients Only)       Balance Overall balance assessment: Needs assistance Sitting-balance support: Feet supported Sitting balance-Leahy Scale: Fair     Standing balance support: During functional activity;Bilateral upper extremity supported Standing balance-Leahy Scale: Fair  Cognition Arousal/Alertness: Awake/alert Behavior During Therapy: Anxious Overall Cognitive Status: Within Functional Limits for tasks assessed                                        Exercises Total Joint Exercises Quad Sets: AROM;10 reps;Left;Seated Heel Slides: AAROM;10 reps;Seated;Left Goniometric ROM: 20-0-62     General Comments        Pertinent Vitals/Pain Pain Assessment: No/denies pain    Home Living                      Prior Function            PT Goals (current goals can now be found in the care plan section) Acute Rehab PT Goals PT Goal Formulation: With patient Time For Goal Achievement: 01/08/20 Potential to Achieve Goals: Good Progress towards PT goals: Progressing toward goals    Frequency    7X/week      PT Plan Current plan remains appropriate    Co-evaluation              AM-PAC PT "6 Clicks" Mobility   Outcome Measure  Help needed turning from your back to your side while in a flat bed without using bedrails?: A Little Help needed moving from lying on your back to sitting on the side of a flat bed without using bedrails?: A Little Help needed moving to and from a bed to a chair (including a wheelchair)?: A Little Help needed standing up from a chair using your arms (e.g., wheelchair or bedside chair)?: A Little Help needed to walk in hospital room?: A Lot Help needed climbing 3-5 steps with a railing? : Total 6 Click Score: 15    End of Session Equipment Utilized During Treatment: Gait belt Activity Tolerance: Patient tolerated treatment well Patient left: in chair;with call bell/phone within reach Nurse Communication: Mobility status PT Visit Diagnosis: Other abnormalities of gait and mobility (R26.89);Pain Pain - Right/Left: Left Pain - part of body: Knee     Time: 5300-5110 PT Time Calculation (min) (ACUTE ONLY): 25 min  Charges:  $Gait Training: 8-22 mins $Therapeutic Activity: 8-22 mins                     Rayen Dafoe, SPT Acute Rehab  [phone]    Arlis Everly 01/02/2020, 4:45 PM

## 2020-01-02 NOTE — Progress Notes (Addendum)
Received message after hours that patient is for discharge home today; Shanita LCSW has set the patient up with [facility] for [facility] services and she ordered DME through [employer] - 3:1; she has a rolling walker at home. Message sent to Tiffany with [facility]- pt is for discharge home today.  Abelino Derrick [facility] Advanced Care Supervisor [phone]

## 2020-01-02 NOTE — TOC Initial Note (Addendum)
Transition of Care [facility]) - Initial/Assessment Note    Patient Details  Name: Lydia [name] MRN: [id number] Date of Birth: 08-16-1966  Transition of Care [facility]) CM/SW Contact:    Truddie Hidden, LCSW Phone Number: 01/02/2020, 2:19 PM  Clinical Narrative:                  Update 3:16pm 3in1 will have to be shipped/delivered to patient's home due to none in stock on campus. Admitted with history of end-stage DJD left knee and pain comes in for preop evaluation.   CSW spoke with patient about dispo plan. PT recommending SNF/24 assistance at home. Patient is refusing SNF and stating that she wishes to return home. She states that her daughter is close by and has agreed to help out as needed.   PT recommending rolling walker and 3in1. Patient declined for CSW to order the rolling walker stating she already has one, but is agreeable to the 3in1. CSW will order DME for delivery to room prior to discharge.   TOC will continue to follow for dispo needs.   Expected Discharge Plan: Home w Home Health Services Barriers to Discharge: Continued Medical Work up   Patient Goals and CMS Choice Patient states their goals for this hospitalization and ongoing recovery are:: return home CMS Medicare.gov Compare Post Acute Care list provided to:: Patient Choice offered to / list presented to : Patient  Expected Discharge Plan and Services Expected Discharge Plan: Home w Home Health Services In-house Referral: Clinical Social Work   Post Acute Care Choice: Durable Medical Equipment, Home Health Living arrangements for the past 2 months: Single Family Home                 DME Arranged: 3-N-1 DME Agency: [employer] Date DME Agency Contacted: 01/02/20 Time DME Agency Contacted: [phone] Representative spoke with at DME Agency: Ian Malkin HH Arranged: PT   Date HH Agency Contacted: 01/02/20 Time HH Agency Contacted: [phone] Representative spoke with at [facility] Agency: Tiffany  Prior Living  Arrangements/Services Living arrangements for the past 2 months: Single Family Home Lives with:: Self   Do you feel safe going back to the place where you live?: Yes      Need for Family Participation in Patient Care: Yes (Comment) Care giver support system in place?: Yes (comment)      Activities of Daily Living Home Assistive Devices/Equipment: Eyeglasses, Oxygen, Scales, Dentures (specify type) ADL Screening (condition at time of admission) Patient's cognitive ability adequate to safely complete daily activities?: Yes Is the patient deaf or have difficulty hearing?: No Does the patient have difficulty seeing, even when wearing glasses/contacts?: No Does the patient have difficulty concentrating, remembering, or making decisions?: No Patient able to express need for assistance with ADLs?: Yes Does the patient have difficulty dressing or bathing?: No Independently performs ADLs?: Yes (appropriate for developmental age) Communication: Independent Dressing (OT): Needs assistance Is this a change from baseline?: Change from baseline, expected to last >3 days Grooming: Independent Feeding: Independent Bathing: Needs assistance Is this a change from baseline?: Change from baseline, expected to last >3 days Toileting: Needs assistance Is this a change from baseline?: Change from baseline, expected to last >3days In/Out Bed: Dependent Is this a change from baseline?: Change from baseline, expected to last <3 days Walks in Home: Needs assistance Is this a change from baseline?: Change from baseline, expected to last >3 days Does the patient have difficulty walking or climbing stairs?: Yes Weakness of Legs: Both Weakness  of Arms/Hands: Both  Permission Sought/Granted                  Emotional Assessment       Orientation: : Oriented to Self, Oriented to Place, Oriented to  Time, Oriented to Situation      Admission diagnosis:  Arthritis of left knee [M17.12] S/P total  knee arthroplasty [Z96.659] Patient Active Problem List   Diagnosis Date Noted  . S/P total knee arthroplasty 01/01/2020  . Arthritis of left knee 12/31/2019  . Unilateral primary osteoarthritis, left knee 12/16/2019  . CAD (coronary artery disease) 02/01/2016  . Sepsis ([place]) 01/29/2016  . Respiratory failure ([place]) 01/29/2016  . Influenza   . Chronic obstructive pulmonary disease with acute exacerbation ([place])   . Cough    PCP:  Imagene Riches, NP Pharmacy:   [place], [place] - [address] [place] [address] Phone: [phone] Fax: [phone]     Social Determinants of Health (SDOH) Interventions    Readmission Risk Interventions No flowsheet data found.

## 2020-01-02 NOTE — Progress Notes (Signed)
PT Progress Note for Charges    01/02/20 1600  PT Visit Information  Last PT Received On 01/02/20  PT General Charges  $$ ACUTE PT VISIT 1 Visit  PT Treatments  $Gait Training 8-22 mins  $Therapeutic Activity 8-22 mins  Arletta Bale, DPT  Acute Rehabilitation Services Pager [phone] Office [phone]

## 2020-01-02 NOTE — Progress Notes (Signed)
Patient was given discharged instruction patient voiced her understanding IV site was removed 10mg  Oxi IR and 500 Robaxin given for pain 8/10 patient was given all her belongings. 10/10 LPN

## 2020-01-02 NOTE — Progress Notes (Signed)
Physical Therapy Treatment Patient Details Name: Lydia [name] MRN: [id number] DOB: June 24, 1966 Today's Date: 01/02/2020    History of Present Illness Patient is 54 y.o. female s/p L TKA on 12/31/19 with PMH significant for OA, RA, fibromyalgia, depression, COPD, CHF, anxiety, CAD, dyspnea.    PT Comments    Pt very limited this session secondary to pain (reported 10/10). She was only able to tolerate bed mobility and transfers with min A and use of RW. Will see pt for a second session today with hopes that pain is better managed to allow for functional progress. Currently recommending that pt d/c to SNF for further intensive therapy prior to returning home. Pt would continue to benefit from skilled physical therapy services at this time while admitted and after d/c to address the below listed limitations in order to improve overall safety and independence with functional mobility.    Follow Up Recommendations  SNF;Other (comment)(if pt refuses, will need HHPT and 24/7 supervision)     Equipment Recommendations  Rolling walker with 5" wheels;3in1 (PT)    Recommendations for Other Services       Precautions / Restrictions Precautions Precautions: Fall;Knee Required Braces or Orthoses: Knee Immobilizer - Left Restrictions Weight Bearing Restrictions: Yes LLE Weight Bearing: Weight bearing as tolerated    Mobility  Bed Mobility Overal bed mobility: Needs Assistance Bed Mobility: Supine to Sit;Sit to Supine     Supine to sit: HOB elevated;Min assist Sit to supine: Min assist   General bed mobility comments: increased time and effort, use of bed rail, assistance needed for L LE movement off of and back onto bed  Transfers Overall transfer level: Needs assistance Equipment used: Rolling walker (2 wheeled) Transfers: Sit to/from [facility] Sit to Stand: Min assist Stand pivot transfers: Min assist       General transfer comment: pt very anxious and a bit  rushed as she needed to urinate; min A for stability with transitional movement from bed to [facility] and back to bed   Ambulation/Gait             General Gait Details: pt declining secondary to pain (reported 10/10)   Stairs             Wheelchair Mobility    Modified Rankin (Stroke Patients Only)       Balance Overall balance assessment: Needs assistance Sitting-balance support: Feet supported Sitting balance-Leahy Scale: Fair     Standing balance support: During functional activity;Bilateral upper extremity supported Standing balance-Leahy Scale: Poor                              Cognition Arousal/Alertness: Awake/alert Behavior During Therapy: Anxious Overall Cognitive Status: Within Functional Limits for tasks assessed                                        Exercises Total Joint Exercises Long Arc Quad: AAROM;Left;15 reps;Seated Knee Flexion: AAROM;Left;15 reps;Seated    General Comments        Pertinent Vitals/Pain Pain Assessment: 0-10 Pain Score: 10-Worst pain ever Pain Location: L knee Pain Descriptors / Indicators: Discomfort;Guarding;Grimacing;Moaning Pain Intervention(s): Monitored during session;Repositioned;Patient requesting pain meds-RN notified    Home Living                      Prior Function  PT Goals (current goals can now be found in the care plan section) Acute Rehab PT Goals PT Goal Formulation: With patient Time For Goal Achievement: 01/08/20 Potential to Achieve Goals: Good Progress towards PT goals: Progressing toward goals    Frequency    7X/week      PT Plan Current plan remains appropriate    Co-evaluation              AM-PAC PT "6 Clicks" Mobility   Outcome Measure  Help needed turning from your back to your side while in a flat bed without using bedrails?: A Little Help needed moving from lying on your back to sitting on the side of a flat bed  without using bedrails?: A Little Help needed moving to and from a bed to a chair (including a wheelchair)?: A Little Help needed standing up from a chair using your arms (e.g., wheelchair or bedside chair)?: A Little Help needed to walk in hospital room?: A Lot Help needed climbing 3-5 steps with a railing? : Total 6 Click Score: 15    End of Session Equipment Utilized During Treatment: Gait belt Activity Tolerance: Patient limited by pain Patient left: in bed;with call bell/phone within reach;Other (comment)(pt refusing OOB to recliner chair at this time) Nurse Communication: Mobility status;Patient requests pain meds PT Visit Diagnosis: Other abnormalities of gait and mobility (R26.89);Pain Pain - Right/Left: Left Pain - part of body: Knee     Time: 1287-8676 PT Time Calculation (min) (ACUTE ONLY): 20 min  Charges:  $Therapeutic Activity: 8-22 mins                     Arletta Bale, DPT  Acute Rehabilitation Services Pager [phone] Office [phone]     Alessandra Bevels Kristofor Michalowski 01/02/2020, 10:28 AM

## 2020-01-02 NOTE — Discharge Planning (Signed)
Spoke with Jiles Crocker, Sup for Transition Team related to discharge order written after 5 per Dr. Ophelia Charter. 3 in 1 ordered but out of stock , will be delivered to home. Per Steward Drone the [facility] referral will be done in the morning. Ortho to obtain Bone Foam.

## 2020-01-05 ENCOUNTER — Telehealth: Payer: Self-pay | Admitting: Orthopaedic Surgery

## 2020-01-05 NOTE — Telephone Encounter (Signed)
I left voicemail for Darnelle advising.

## 2020-01-05 NOTE — Telephone Encounter (Signed)
Ok for orders? 

## 2020-01-05 NOTE — Telephone Encounter (Signed)
OK - thanks

## 2020-01-05 NOTE — Telephone Encounter (Signed)
Darnelle from [facility] called.   Patient recently had a knee replacement. Requesting verbal orders for home PT 1wk1 3wk1 2wk1  Call back: [phone]

## 2020-01-13 NOTE — Discharge Summary (Signed)
Patient ID: Lydia [name] MRN: [id number] DOB/AGE: 56-Aug-1967 56 y.o.  Admit date: 12/31/2019 Discharge date: 01/02/2020 Admission Diagnoses:  Active Problems:   Arthritis of left knee   S/P total knee arthroplasty   Discharge Diagnoses:  Active Problems:   Arthritis of left knee   S/P total knee arthroplasty  status post Procedure(s): LEFT TOTAL KNEE ARTHROPLASTY  Past Medical History:  Diagnosis Date  . Anginal pain (HCC)    per patient  . Anxiety    per patient   . CHF (congestive heart failure) (HCC)   . COPD (chronic obstructive pulmonary disease) (HCC)   . Coronary artery disease   . Depression   . Dyspnea    per patient due to COPD, and with walking long distances per patient  . Fibromyalgia   . History of hiatal hernia   . Mitral regurgitation    Moderate MR 03/2019  . On supplemental oxygen by nasal cannula    per patient 2L at night  . Pneumonia   . Rheumatoid arthritis (HCC)    per patient diagnosed within the past 2 years, stated on 12/24/2019  . Sleep apnea    per patient diagnosed about 3-4 years ago and does not use a CPAP at night    Surgeries: Procedure(s): LEFT TOTAL KNEE ARTHROPLASTY on 12/31/2019   Consultants:   Discharged Condition: Improved  Hospital Course: Lydia [name] is an 54 y.o. female who was admitted 12/31/2019 for operative treatment of left knee arthritis. Patient failed conservative treatments (please see the history and physical for the specifics) and had severe unremitting pain that affects sleep, daily activities and work/hobbies. After pre-op clearance, the patient was taken to the operating room on 12/31/2019 and underwent  Procedure(s): LEFT TOTAL KNEE ARTHROPLASTY.    Patient was given perioperative antibiotics:  Anti-infectives (From admission, onward)   Start     Dose/Rate Route Frequency Ordered Stop   12/31/19 1345  ceFAZolin (ANCEF) IVPB 2g/100 mL premix     2 g 200 mL/hr over 30 Minutes Intravenous On call to O.R.  12/31/19 1323 12/31/19 1543   12/31/19 1324  ceFAZolin (ANCEF) 2-4 GM/100ML-% IVPB    Note to Pharmacy: Tawanna Sat   : cabinet override      12/31/19 1324 12/31/19 1549       Patient was given sequential compression devices and early ambulation to prevent DVT.   Patient benefited maximally from hospital stay and there were no complications. At the time of discharge, the patient was urinating/moving their bowels without difficulty, tolerating a regular diet, pain is controlled with oral pain medications and they have been cleared by PT/OT.   Recent vital signs: No data found.   Recent laboratory studies: No results for input(s): WBC, HGB, HCT, PLT, NA, K, CL, CO2, BUN, CREATININE, GLUCOSE, INR, CALCIUM in the last 72 hours.  Invalid input(s): PT, 2   Discharge Medications:   Allergies as of 01/02/2020      Reactions   Nsaids Nausea Only, Other (See Comments)   Heartburn      Medication List    STOP taking these medications   HYDROcodone-acetaminophen 5-325 MG tablet Commonly known as: Norco   ibuprofen 600 MG tablet Commonly known as: ADVIL     TAKE these medications   albuterol 108 (90 Base) MCG/ACT inhaler Commonly known as: VENTOLIN HFA Inhale 2 puffs into the lungs every 6 (six) hours as needed for wheezing or shortness of breath.   aspirin 325 MG EC tablet  Take 1 tablet (325 mg total) by mouth daily with breakfast.   benztropine 0.5 MG tablet Commonly known as: COGENTIN Take 0.5 mg by mouth 2 (two) times daily.   Buprenorphine HCl-Naloxone HCl 8-2 MG Film Place 1 Film under the tongue in the morning and at bedtime.   buPROPion 100 MG tablet Commonly known as: WELLBUTRIN Take 100 mg by mouth daily.   cetirizine 10 MG tablet Commonly known as: ZYRTEC Take 10 mg by mouth at bedtime.   cyclobenzaprine 10 MG tablet Commonly known as: FLEXERIL Take 10 mg by mouth 3 (three) times daily as needed for muscle spasms.   ezetimibe 10 MG tablet Commonly known  as: ZETIA Take 10 mg by mouth daily.   fenofibrate 145 MG tablet Commonly known as: TRICOR Take 145 mg by mouth daily.   fluticasone 50 MCG/ACT nasal spray Commonly known as: FLONASE Place 2 sprays into both nostrils daily.   folic acid 1 MG tablet Commonly known as: FOLVITE Take 1 mg by mouth daily.   hydrochlorothiazide 25 MG tablet Commonly known as: HYDRODIURIL Take 25 mg by mouth daily.   isosorbide mononitrate 60 MG 24 hr tablet Commonly known as: IMDUR Take 60 mg by mouth daily.   Klor-Con M20 20 MEQ tablet Generic drug: potassium chloride SA Take 20 mEq by mouth 2 (two) times daily.   magnesium oxide 400 MG tablet Commonly known as: MAG-OX Take 400 mg by mouth 2 (two) times daily.   meloxicam 7.5 MG tablet Commonly known as: Mobic Take 1 tablet (7.5 mg total) by mouth daily.   metoprolol tartrate 25 MG tablet Commonly known as: LOPRESSOR Take 25 mg by mouth 2 (two) times daily.   montelukast 10 MG tablet Commonly known as: SINGULAIR Take 10 mg by mouth at bedtime.   nitroGLYCERIN 0.4 MG SL tablet Commonly known as: NITROSTAT Place 0.4 mg under the tongue every 5 (five) minutes as needed for chest pain.   oseltamivir 75 MG capsule Commonly known as: TAMIFLU Take 1 capsule (75 mg total) by mouth 2 (two) times daily.   oxyCODONE-acetaminophen 5-325 MG tablet Commonly known as: Percocet Take 1-2 tablets by mouth every 6 (six) hours as needed for severe pain.   OXYGEN Inhale 2 L into the lungs at bedtime.   pantoprazole 40 MG tablet Commonly known as: PROTONIX Take 40 mg by mouth daily.   predniSONE 10 MG tablet Commonly known as: DELTASONE Please take 40 mg oral daily  for 3 days, then 30 mg oral daily for 3 days, then 20 mg oral daily for 3 days, then 10 mg oral daily for 3 days then stop.   risperiDONE 3 MG tablet Commonly known as: RISPERDAL Take 3 mg by mouth at bedtime.   solifenacin 10 MG tablet Commonly known as: VESICARE Take 10 mg  by mouth daily.   topiramate 50 MG tablet Commonly known as: TOPAMAX Take 50 mg by mouth at bedtime.   Toviaz 8 MG Tb24 tablet Generic drug: fesoterodine Take 8 mg by mouth daily.   venlafaxine XR 150 MG 24 hr capsule Commonly known as: EFFEXOR-XR Take 300 mg by mouth daily with breakfast.   Vitamin D (Ergocalciferol) 1.25 MG (50000 UNIT) Caps capsule Commonly known as: DRISDOL Take 50,000 Units by mouth every 30 (thirty) days.       Diagnostic Studies: DG Chest 2 View  Result Date: 12/24/2019 CLINICAL DATA:  Pre-admission x-ray prior to left total knee arthroplasty. EXAM: CHEST - 2 VIEW COMPARISON:  September 09, 2018 FINDINGS: Heart size is mildly enlarged but stable. The hila and mediastinum are normal. No pneumothorax. No nodules or masses. No focal infiltrates. No other acute abnormalities. IMPRESSION: No active cardiopulmonary disease. Electronically Signed   By: Gerome Sam III M.D   On: 12/24/2019 16:46   DG Knee 1-2 Views Left  Result Date: 12/31/2019 CLINICAL DATA:  54 year old female status post left knee arthroplasty. EXAM: LEFT KNEE - 1-2 VIEW COMPARISON:  Left knee radiograph dated 12/04/2019. FINDINGS: There is a total left knee arthroplasty. The arthroplasty components appear intact and in anatomic alignment. There is no acute fracture or dislocation. The bones are osteopenic. Small amount of air and fluid in the suprapatellar space, postoperative. Cutaneous clips of the anterior knee and soft tissue edema. IMPRESSION: Postsurgical changes of left knee arthroplasty. Electronically Signed   By: Elgie Collard M.D.   On: 12/31/2019 18:32      Follow-up Information    Eldred Manges, MD Follow up in 1 week(s).   Specialty: Orthopedic Surgery Contact information: [address] [place] [address] [phone]        Home, [facility] Follow up.   Specialty: Home Health Services Why: They will do your home health care at your home Contact  information: [address] [place] [address] [phone]           Discharge Plan:  discharge to home  Disposition:     Signed: Zonia Kief  01/13/2020, 2:35 PM

## 2020-01-14 ENCOUNTER — Ambulatory Visit (INDEPENDENT_AMBULATORY_CARE_PROVIDER_SITE_OTHER): Payer: Medicare Other | Admitting: Orthopaedic Surgery

## 2020-01-14 ENCOUNTER — Other Ambulatory Visit: Payer: Self-pay

## 2020-01-14 ENCOUNTER — Ambulatory Visit (INDEPENDENT_AMBULATORY_CARE_PROVIDER_SITE_OTHER): Payer: Medicare Other

## 2020-01-14 ENCOUNTER — Inpatient Hospital Stay: Payer: Medicare Other | Admitting: Orthopaedic Surgery

## 2020-01-14 ENCOUNTER — Encounter: Payer: Self-pay | Admitting: Orthopaedic Surgery

## 2020-01-14 VITALS — BP 97/75 | Ht 62.0 in | Wt 193.0 lb

## 2020-01-14 DIAGNOSIS — Z9889 Other specified postprocedural states: Secondary | ICD-10-CM | POA: Diagnosis not present

## 2020-01-14 NOTE — Progress Notes (Signed)
   Post-Op Visit Note   Patient: Lydia [name]           Date of Birth: 19-Dec-1965           MRN: [id number] Visit Date: 01/14/2020 PCP: Dema Severin, NP   Assessment & Plan:  Chief Complaint:  Chief Complaint  Patient presents with  . Left Knee - Routine Post Op   Visit Diagnoses:  1. S/P left knee surgery     Plan: Patient will transition to outpatient physical therapy.  She needs to work hard to get full extension.  She is flexing to 90 degrees.  Still has extension lag with quad weakness.  Follow-Up Instructions: No follow-ups on file.   Orders:  Orders Placed This Encounter  Procedures  . XR Knee 1-2 Views Left   No orders of the defined types were placed in this encounter.   Imaging: No results found.  PMFS History: Patient Active Problem List   Diagnosis Date Noted  . S/P total knee arthroplasty 01/01/2020  . Arthritis of left knee 12/31/2019  . Unilateral primary osteoarthritis, left knee 12/16/2019  . CAD (coronary artery disease) 02/01/2016  . Sepsis (HCC) 01/29/2016  . Respiratory failure (HCC) 01/29/2016  . Influenza   . Chronic obstructive pulmonary disease with acute exacerbation (HCC)   . Cough    Past Medical History:  Diagnosis Date  . Anginal pain (HCC)    per patient  . Anxiety    per patient   . CHF (congestive heart failure) (HCC)   . COPD (chronic obstructive pulmonary disease) (HCC)   . Coronary artery disease   . Depression   . Dyspnea    per patient due to COPD, and with walking long distances per patient  . Fibromyalgia   . History of hiatal hernia   . Mitral regurgitation    Moderate MR 03/2019  . On supplemental oxygen by nasal cannula    per patient 2L at night  . Pneumonia   . Rheumatoid arthritis (HCC)    per patient diagnosed within the past 2 years, stated on 12/24/2019  . Sleep apnea    per patient diagnosed about 3-4 years ago and does not use a CPAP at night    Family History  Problem Relation Age of Onset    . Cancer Father     Past Surgical History:  Procedure Laterality Date  . ABDOMINAL HYSTERECTOMY    . CARDIAC CATHETERIZATION    . CORONARY ANGIOPLASTY     DES RCA x3 01/05/16  . KNEE SURGERY Left   . TONSILLECTOMY    . TOTAL KNEE ARTHROPLASTY Left 12/31/2019   Procedure: LEFT TOTAL KNEE ARTHROPLASTY;  Surgeon: Eldred Manges, MD;  Location: [facility] OR;  Service: Orthopedics;  Laterality: Left;   Social History   Occupational History  . Not on file  Tobacco Use  . Smoking status: Current Every Day Smoker    Packs/day: 1.00    Types: Cigarettes  . Smokeless tobacco: Never Used  Substance and Sexual Activity  . Alcohol use: No  . Drug use: Yes    Types: Marijuana    Comment: per patient "maybe 3-4 times a week"  . Sexual activity: Not on file

## 2020-01-14 NOTE — Addendum Note (Signed)
Addended by: Mardene Celeste B on: 01/14/2020 09:32 AM   Modules accepted: Orders

## 2020-01-19 ENCOUNTER — Telehealth: Payer: Self-pay | Admitting: Radiology

## 2020-01-19 NOTE — Telephone Encounter (Signed)
Peter, HHPT requests verbal orders to extend HHPT 2x week x 4 weeks for left knee.  CB for Lydia [name] [phone]  Please advise.

## 2020-01-20 NOTE — Telephone Encounter (Signed)
I called Theron Arista and advised.

## 2020-01-20 NOTE — Telephone Encounter (Signed)
Needs  to be going to outpatient PT , please set that up . Continue HHPT if there is a delay to get an appt so no lag between them thanks

## 2020-01-20 NOTE — Telephone Encounter (Signed)
I called and advised. Patient told Theron Arista, HHPT that she does not have anyone to drive her to therapy and has no way to get there. She is unable to drive herself. Please let me know what you would like to do.

## 2020-01-20 NOTE — Telephone Encounter (Signed)
Ok continue HHPT 

## 2020-02-11 ENCOUNTER — Ambulatory Visit (INDEPENDENT_AMBULATORY_CARE_PROVIDER_SITE_OTHER): Payer: Medicare Other | Admitting: Orthopaedic Surgery

## 2020-02-11 ENCOUNTER — Encounter: Payer: Self-pay | Admitting: Orthopaedic Surgery

## 2020-02-11 ENCOUNTER — Other Ambulatory Visit: Payer: Self-pay

## 2020-02-11 VITALS — Ht 62.0 in | Wt 193.0 lb

## 2020-02-11 DIAGNOSIS — Z96652 Presence of left artificial knee joint: Secondary | ICD-10-CM

## 2020-02-11 DIAGNOSIS — M1712 Unilateral primary osteoarthritis, left knee: Secondary | ICD-10-CM

## 2020-02-11 NOTE — Progress Notes (Signed)
   Post-Op Visit Note   Patient: Lydia [name]           Date of Birth: 12-Mar-1966           MRN: [id number] Visit Date: 02/11/2020 PCP: Dema Severin, NP   Assessment & Plan: Patient returns post left total knee arthroplasty.  Incision looks good she lacks a few degrees reaching full extension still has some quad weakness but is walking without a cane.  Flexion is past 100 degrees which is good.  Chief Complaint:  Chief Complaint  Patient presents with  . Left Knee - Follow-up    12/31/2019 Left TKA   Visit Diagnoses:  1. Unilateral primary osteoarthritis, left knee   2. Status post total left knee replacement     Plan: Transition outpatient therapy should like to go to [place] which is close to where she lives.  I will recheck her again in 1 month.  Follow-Up Instructions: Return in about 2 months (around 04/12/2020).   Orders:  No orders of the defined types were placed in this encounter.  No orders of the defined types were placed in this encounter.   Imaging: No results found.  PMFS History: Patient Active Problem List   Diagnosis Date Noted  . S/P total knee arthroplasty 01/01/2020  . CAD (coronary artery disease) 02/01/2016  . Sepsis (HCC) 01/29/2016  . Respiratory failure (HCC) 01/29/2016  . Influenza   . Chronic obstructive pulmonary disease with acute exacerbation (HCC)   . Cough    Past Medical History:  Diagnosis Date  . Anginal pain (HCC)    per patient  . Anxiety    per patient   . CHF (congestive heart failure) (HCC)   . COPD (chronic obstructive pulmonary disease) (HCC)   . Coronary artery disease   . Depression   . Dyspnea    per patient due to COPD, and with walking long distances per patient  . Fibromyalgia   . History of hiatal hernia   . Mitral regurgitation    Moderate MR 03/2019  . On supplemental oxygen by nasal cannula    per patient 2L at night  . Pneumonia   . Rheumatoid arthritis (HCC)    per patient diagnosed within the  past 2 years, stated on 12/24/2019  . Sleep apnea    per patient diagnosed about 3-4 years ago and does not use a CPAP at night    Family History  Problem Relation Age of Onset  . Cancer Father     Past Surgical History:  Procedure Laterality Date  . ABDOMINAL HYSTERECTOMY    . CARDIAC CATHETERIZATION    . CORONARY ANGIOPLASTY     DES RCA x3 01/05/16  . KNEE SURGERY Left   . TONSILLECTOMY    . TOTAL KNEE ARTHROPLASTY Left 12/31/2019   Procedure: LEFT TOTAL KNEE ARTHROPLASTY;  Surgeon: Eldred Manges, MD;  Location: [facility] OR;  Service: Orthopedics;  Laterality: Left;   Social History   Occupational History  . Not on file  Tobacco Use  . Smoking status: Current Every Day Smoker    Packs/day: 1.00    Types: Cigarettes  . Smokeless tobacco: Never Used  Substance and Sexual Activity  . Alcohol use: No  . Drug use: Yes    Types: Marijuana    Comment: per patient "maybe 3-4 times a week"  . Sexual activity: Not on file

## 2020-03-09 ENCOUNTER — Ambulatory Visit: Payer: Medicare Other | Admitting: Orthopaedic Surgery

## 2020-10-25 IMAGING — CR DG CHEST 2V
2 series · 2 of 2 positions shown · non-contrast
Comparison: September 09, 2018

CLINICAL DATA: Pre-admission x-ray prior to left total knee
arthroplasty.

EXAM:
CHEST - 2 VIEW

[w chest pa]
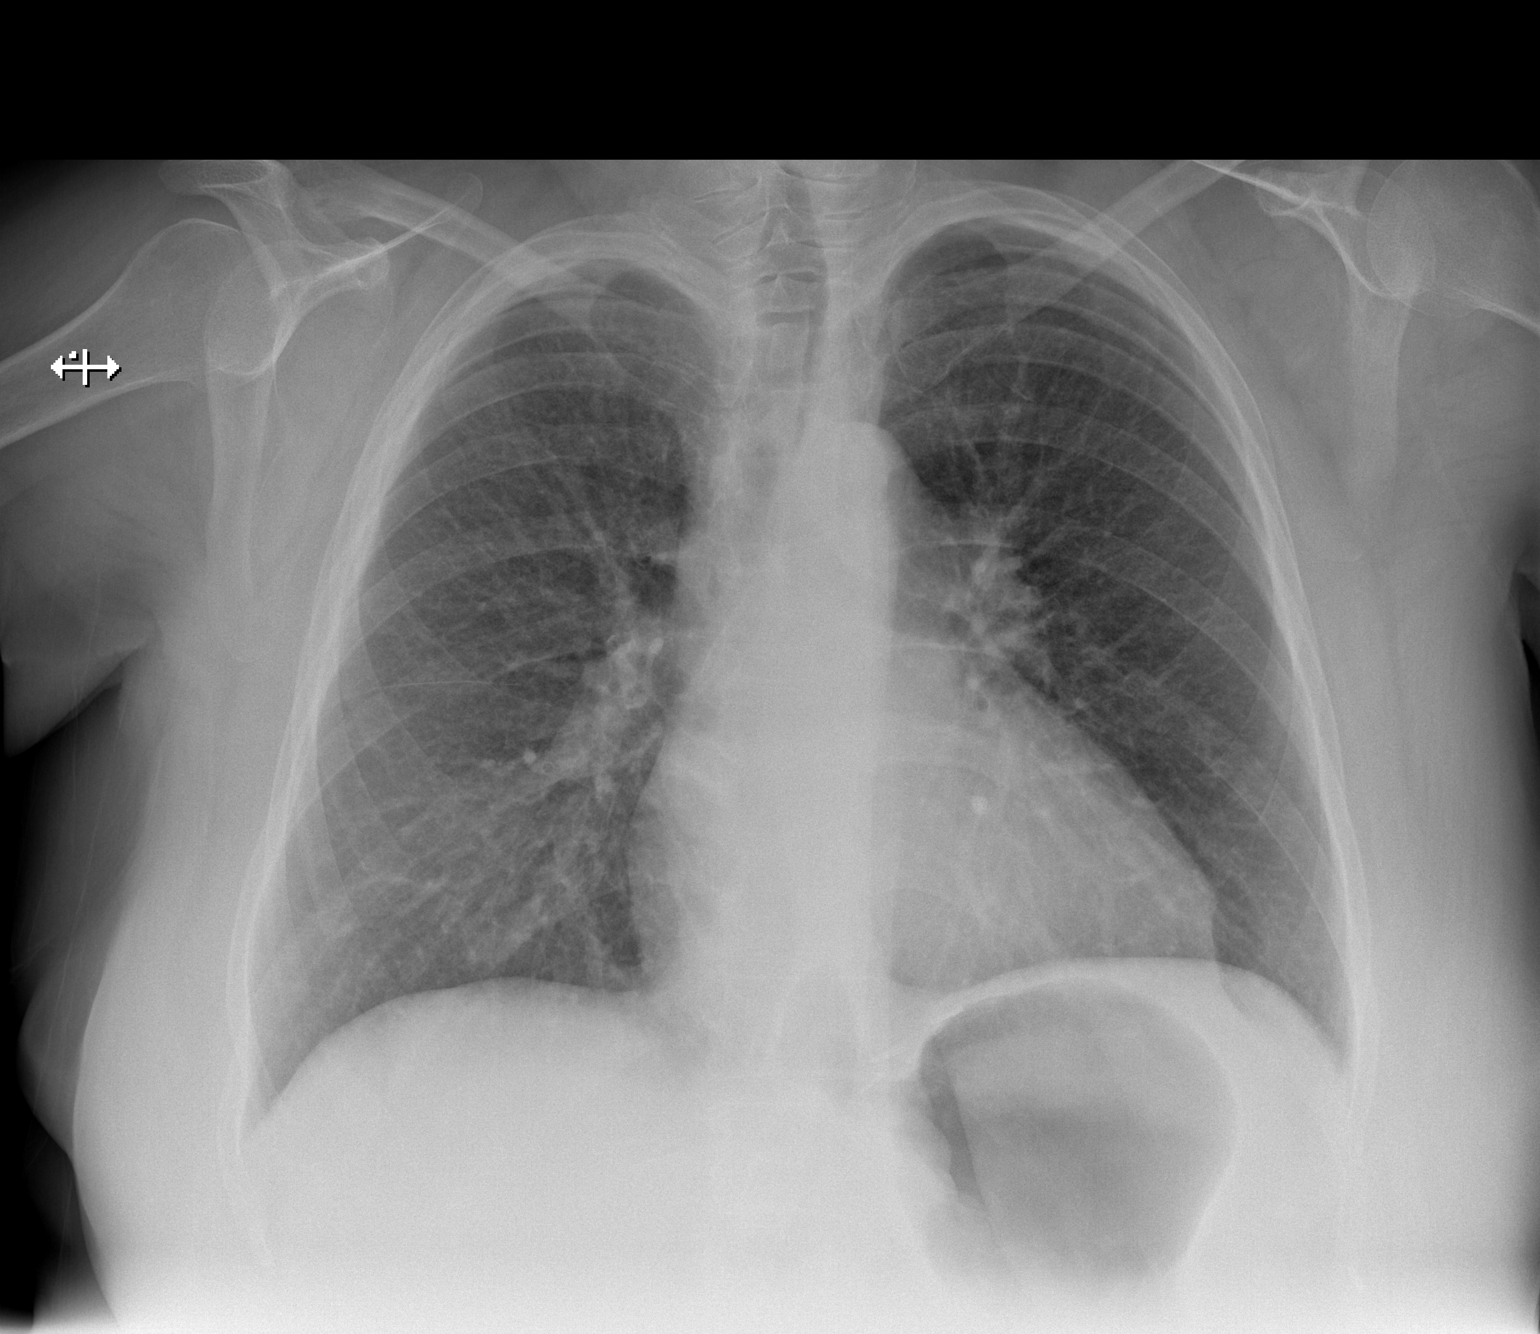

[w chest lat]
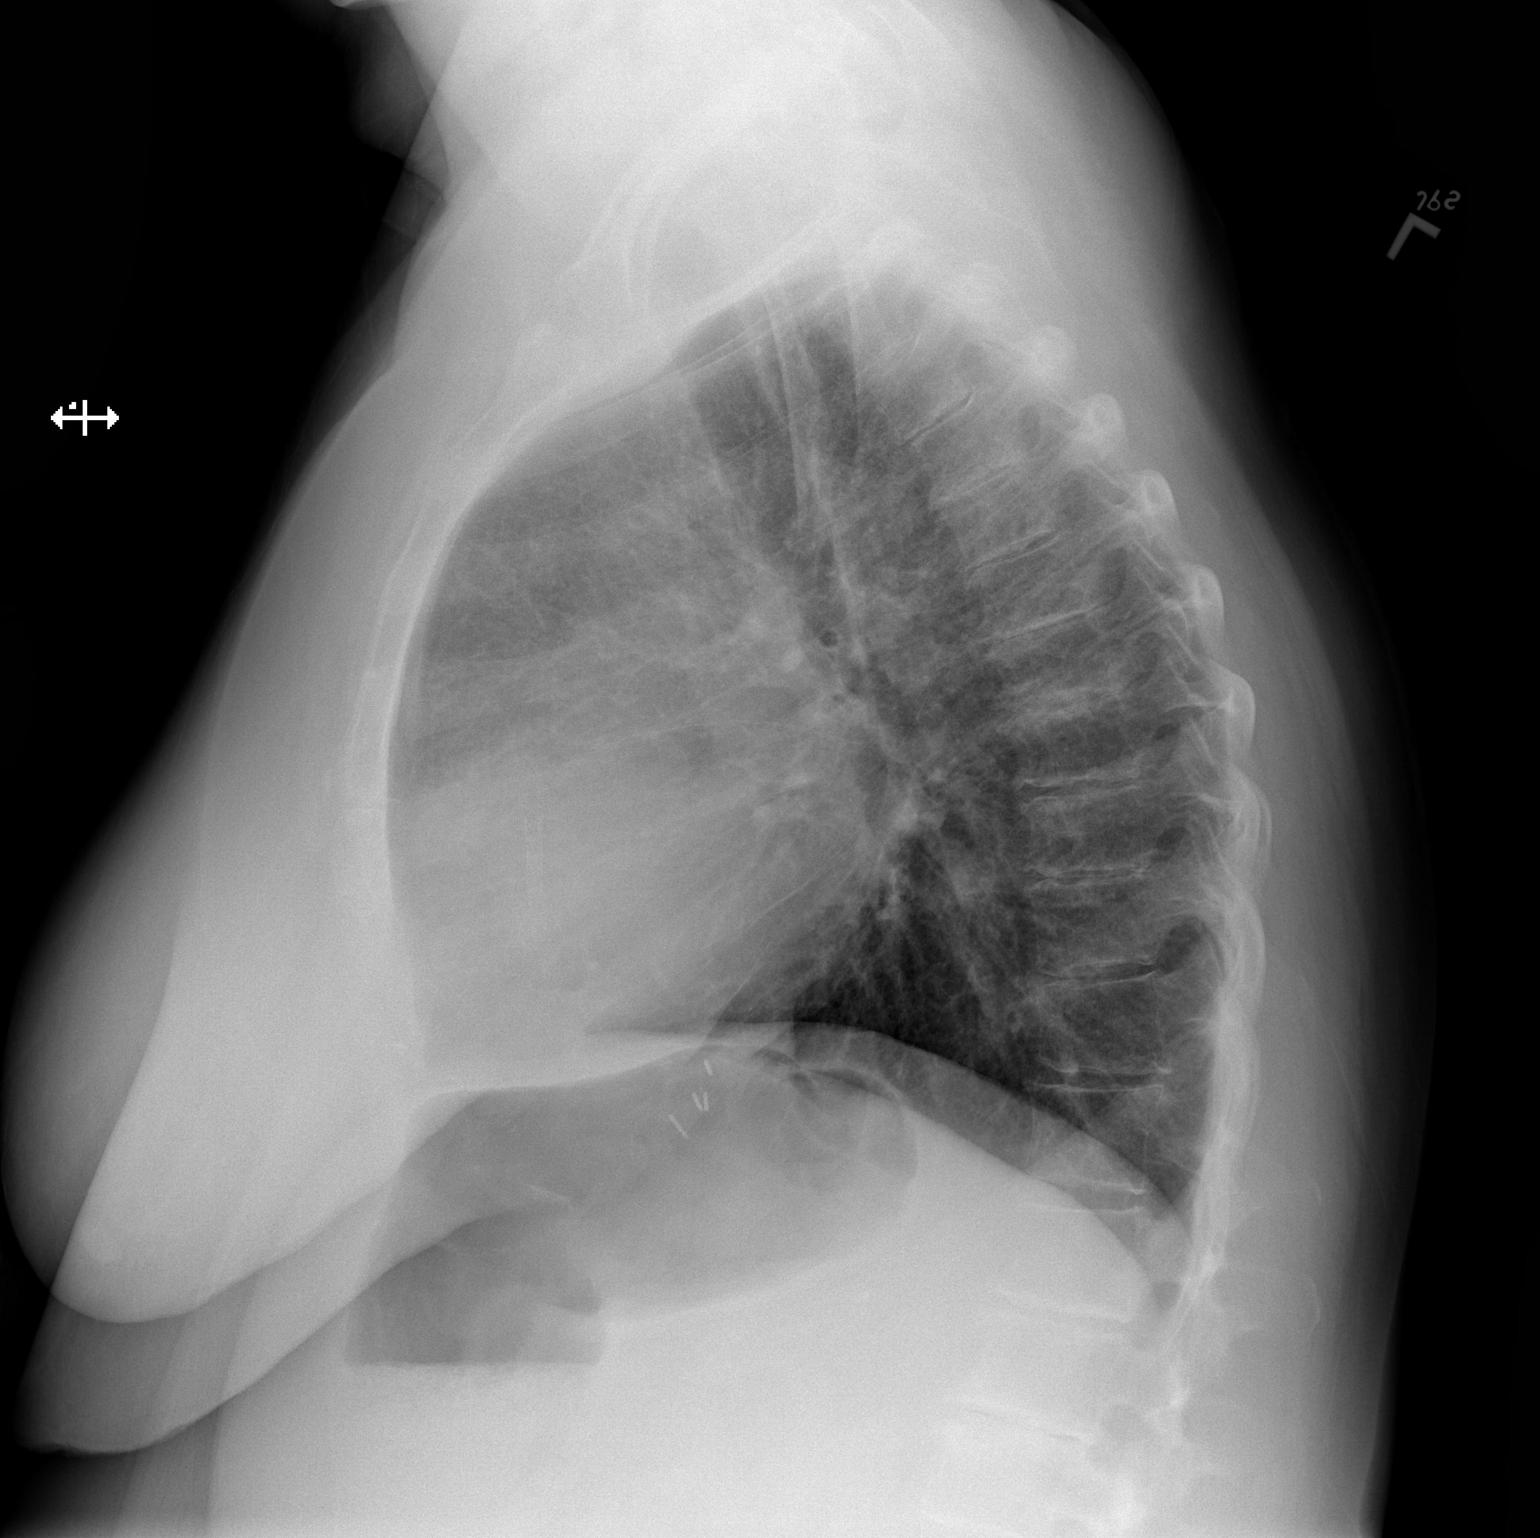

[2 of 2 positions shown; findings below may reference images not displayed]

FINDINGS: Heart size is mildly enlarged but stable. The hila and mediastinum
are normal. No pneumothorax. No nodules or masses. No focal
infiltrates. No other acute abnormalities.
IMPRESSION: No active cardiopulmonary disease.

## 2021-04-06 ENCOUNTER — Ambulatory Visit: Payer: Medicare Other | Admitting: Orthopaedic Surgery

## 2021-04-11 ENCOUNTER — Ambulatory Visit (INDEPENDENT_AMBULATORY_CARE_PROVIDER_SITE_OTHER): Payer: Medicare Other | Admitting: Orthopaedic Surgery

## 2021-04-11 ENCOUNTER — Encounter: Payer: Self-pay | Admitting: Orthopaedic Surgery

## 2021-04-11 VITALS — BP 116/81 | HR 121 | Ht 62.0 in | Wt 185.0 lb

## 2021-04-11 DIAGNOSIS — M5136 Other intervertebral disc degeneration, lumbar region: Secondary | ICD-10-CM

## 2021-04-11 DIAGNOSIS — M545 Low back pain, unspecified: Secondary | ICD-10-CM

## 2021-04-14 DIAGNOSIS — M5136 Other intervertebral disc degeneration, lumbar region: Secondary | ICD-10-CM | POA: Insufficient documentation

## 2021-04-14 NOTE — Progress Notes (Signed)
Office Visit Note   Patient: Lydia [name]           Date of Birth: 1966-07-12           MRN: [id number] Visit Date: 04/11/2021              Requested by: Dema Severin, NP [address] [place],  [place] [address] PCP: Dema Severin, NP   Assessment & Plan: Visit Diagnoses:  1. Left low back pain, unspecified chronicity, unspecified whether sciatica present     Plan: lumbar PACS radiographs 03/17/2021 showed minimal lumbar curvature with advanced disc degeneration at L2-3 with endplate sclerosis and advanced degenerative changes.  We will set patient up with some physical therapy.  She can follow-up if persistent symptoms then diagnostic MRI imaging can be considered.  Follow-Up Instructions: No follow-ups on file.   Orders:  Orders Placed This Encounter  Procedures   Ambulatory referral to Physical Therapy   No orders of the defined types were placed in this encounter.     Procedures: No procedures performed   Clinical Data: No additional findings.   Subjective: Chief Complaint  Patient presents with   Lower Back - Pain   Left Hip - Pain    HPI 55 year old female post left total hip arthroplasty March 2021 here with back and left hip pain.  Patient denies any symptoms down into her feet.  She had been [facility] at [facility] by [facility] with advanced L2-3 disc degeneration referred here for evaluation and treatment.  She denies associated bowel or bladder symptoms.  Patient's been on chronic Suboxone 8 mg / 2 mg twice daily.  She has also been on Flexeril.  Past history of cirrhosis of the liver.  Patient denies neurogenic claudication symptoms.  Review of Systems negative for chills or fevers.  No associated bowel bladder symptoms. all other systems noncontributory to HPI.   Objective: Vital Signs: BP 116/81   Pulse (!) 121   Ht 5\' 2"  (1.575 m)   Wt 185 lb (83.9 kg)   BMI 33.84 kg/m   Physical Exam Constitutional:      Appearance: She is  well-developed.  HENT:     Head: Normocephalic.     Right Ear: External ear normal.     Left Ear: External ear normal. There is no impacted cerumen.  Eyes:     Pupils: Pupils are equal, round, and reactive to light.  Neck:     Thyroid: No thyromegaly.     Trachea: No tracheal deviation.  Cardiovascular:     Rate and Rhythm: Normal rate.  Pulmonary:     Effort: Pulmonary effort is normal.  Abdominal:     Palpations: Abdomen is soft.  Musculoskeletal:     Cervical back: No rigidity.  Skin:    General: Skin is warm and dry.  Neurological:     Mental Status: She is alert and oriented to person, place, and time.  Psychiatric:        Behavior: Behavior normal.    Ortho Exam patient has negative straight leg raising.  Negative logroll of the hips.  Minimal sciatic notch tenderness.  Specialty Comments:  No specialty comments available.  Imaging: No results found.   PMFS History: Patient Active Problem List   Diagnosis Date Noted   S/P total knee arthroplasty 01/01/2020   CAD (coronary artery disease) 02/01/2016   Sepsis (HCC) 01/29/2016   Respiratory failure (HCC) 01/29/2016   Influenza    Chronic  obstructive pulmonary disease with acute exacerbation (HCC)    Cough    Past Medical History:  Diagnosis Date   Anginal pain (HCC)    per patient   Anxiety    per patient    CHF (congestive heart failure) (HCC)    COPD (chronic obstructive pulmonary disease) (HCC)    Coronary artery disease    Depression    Dyspnea    per patient due to COPD, and with walking long distances per patient   Fibromyalgia    History of hiatal hernia    Mitral regurgitation    Moderate MR 03/2019   On supplemental oxygen by nasal cannula    per patient 2L at night   Pneumonia    Rheumatoid arthritis (HCC)    per patient diagnosed within the past 2 years, stated on 12/24/2019   Sleep apnea    per patient diagnosed about 3-4 years ago and does not use a CPAP at night    Family History   Problem Relation Age of Onset   Cancer Father     Past Surgical History:  Procedure Laterality Date   ABDOMINAL HYSTERECTOMY     CARDIAC CATHETERIZATION     CORONARY ANGIOPLASTY     DES RCA x3 01/05/16   KNEE SURGERY Left    TONSILLECTOMY     TOTAL KNEE ARTHROPLASTY Left 12/31/2019   Procedure: LEFT TOTAL KNEE ARTHROPLASTY;  Surgeon: Eldred Manges, MD;  Location: [facility] OR;  Service: Orthopedics;  Laterality: Left;   Social History   Occupational History   Not on file  Tobacco Use   Smoking status: Every Day    Packs/day: 1.00    Pack years: 0.00    Types: Cigarettes   Smokeless tobacco: Never  Vaping Use   Vaping Use: Never used  Substance and Sexual Activity   Alcohol use: No   Drug use: Yes    Types: Marijuana    Comment: per patient "maybe 3-4 times a week"   Sexual activity: Not on file

## 2021-06-14 ENCOUNTER — Ambulatory Visit: Payer: Medicare Other | Admitting: Orthopaedic Surgery

## 2021-06-16 ENCOUNTER — Ambulatory Visit: Payer: Medicare Other | Admitting: Neurology

## 2021-07-01 ENCOUNTER — Encounter: Payer: Self-pay | Admitting: Neurology

## 2021-07-01 ENCOUNTER — Ambulatory Visit: Payer: Medicare Other | Admitting: Neurology

## 2021-09-15 ENCOUNTER — Encounter: Payer: Self-pay | Admitting: Neurology

## 2021-09-15 ENCOUNTER — Other Ambulatory Visit: Payer: Self-pay

## 2021-09-15 ENCOUNTER — Ambulatory Visit (INDEPENDENT_AMBULATORY_CARE_PROVIDER_SITE_OTHER): Payer: Medicare Other | Admitting: Neurology

## 2021-09-15 ENCOUNTER — Telehealth: Payer: Self-pay | Admitting: Neurology

## 2021-09-15 VITALS — BP 108/75 | HR 80 | Ht 62.0 in | Wt 175.0 lb

## 2021-09-15 DIAGNOSIS — F32A Depression, unspecified: Secondary | ICD-10-CM

## 2021-09-15 DIAGNOSIS — R443 Hallucinations, unspecified: Secondary | ICD-10-CM | POA: Diagnosis not present

## 2021-09-15 NOTE — Progress Notes (Addendum)
Chief Complaint  Patient presents with   Follow-up    Rm 15, alone NP/Paper/Regina York FNP [facility] [phone]/Remote stroke/hallucinations  Would like to discuss stroke prevention       ASSESSMENT AND PLAN  Lydia [name] is a 55 y.o. female   Worsening vision hallucinations Staring spells  Lifelong history of mood disorder, with polypharmacy treatment  Laboratory evaluation to rule out metabolic etiology  MRI of the brain with without contrast  EEG   DIAGNOSTIC DATA (LABS, IMAGING, TESTING) - I reviewed patient records, labs, notes, testing and imaging myself where available.   MEDICAL HISTORY:  Lydia [name], is a 55 year old female, seen in request by his primary care nurse practitioner Mauricio Po for evaluation of hallucinations, initial evaluation was on September 15, 2021.  I reviewed and summarized the referring note. PMHX Allergry Depression, COPD Long time smoker,   She reported a long history of psychiatric disease, carries the diagnosis of depression, on polypharmacy treatment, per patient, is regulated by her primary care physician, this including topiramate 50 mg at nighttime, Effexor 300 mg daily, Risperdal 3 mg daily, she reported intermittent hallucinations when she was younger, but not as vivid  She felt couple times this year, tripping on steps, fell backwards with transient loss of consciousness in May 2022, holding a load of laundry in her arms,  Now she has more vivid hallucinations, it happened during the day and night, she saw shadows initially, now  people at her garage, sometimes even broke into her house, she saw them while driving, she denies auditory hallucinations.  Denies new onset lateralized motor or sensory deficit.  During today's interview, she was noted to have transient movement of staring, lasting for few seconds, then can snap out of it  PHYSICAL EXAM:   Vitals:   09/15/21 0931  BP: 108/75  Pulse: 80   Weight: 175 lb (79.4 kg)  Height:  (1.575 m)   Not recorded     Body mass index is 32.01 kg/m.  PHYSICAL EXAMNIATION:  Gen: NAD, conversant, well nourised, well groomed                     Cardiovascular: Regular rate rhythm, no peripheral edema, warm, nontender. Eyes: Conjunctivae clear without exudates or hemorrhage Neck: Supple, no carotid bruits. Pulmonary: Clear to auscultation bilaterally   NEUROLOGICAL EXAM: Nervous looking middle-aged female  MENTAL STATUS: Speech:    Speech is normal; fluent and spontaneous with normal comprehension.  Cognition:     Orientation to time, place and person     Normal recent and remote memory     Normal Attention span and concentration     Normal Language, naming, repeating,spontaneous speech     Fund of knowledge   CRANIAL NERVES: CN II: Visual fields are full to confrontation. Pupils are round equal and briskly reactive to light. CN III, IV, VI: extraocular movement are normal. No ptosis. CN V: Facial sensation is intact to light touch CN VII: Face is symmetric with normal eye closure  CN VIII: Hearing is normal to causal conversation. CN IX, X: Phonation is normal. CN XI: Head turning and shoulder shrug are intact  MOTOR: There is no pronator drift of out-stretched arms. Muscle bulk and tone are normal. Muscle strength is normal.  REFLEXES: Reflexes are 2+ and symmetric at the biceps, triceps, knees, and ankles. Plantar responses are flexor.  SENSORY: Intact to light touch, pinprick and vibratory sensation are intact in fingers  and toes.  COORDINATION: There is no trunk or limb dysmetria noted.  GAIT/STANCE: Posture is normal. Gait is steady with normal steps, base, arm swing, and turning. Heel and toe walking are normal. Tandem gait is normal.  Romberg is absent.  REVIEW OF SYSTEMS:  Full 14 system review of systems performed and notable only for as above All other review of systems were  negative.   ALLERGIES: Allergies  Allergen Reactions   Nsaids Nausea Only and Other (See Comments)    Heartburn    HOME MEDICATIONS: Current Outpatient Medications  Medication Sig Dispense Refill   albuterol (PROVENTIL HFA;VENTOLIN HFA) 108 (90 Base) MCG/ACT inhaler Inhale 2 puffs into the lungs every 6 (six) hours as needed for wheezing or shortness of breath.      aspirin EC 325 MG EC tablet Take 1 tablet (325 mg total) by mouth daily with breakfast. 30 tablet 0   benztropine (COGENTIN) 0.5 MG tablet Take 0.5 mg by mouth 2 (two) times daily.      Buprenorphine HCl-Naloxone HCl 8-2 MG FILM Place 1 Film under the tongue in the morning and at bedtime.      buPROPion (WELLBUTRIN) 100 MG tablet Take 100 mg by mouth daily.     cetirizine (ZYRTEC) 10 MG tablet Take 10 mg by mouth at bedtime.     cyclobenzaprine (FLEXERIL) 10 MG tablet Take 10 mg by mouth 3 (three) times daily as needed for muscle spasms.     ezetimibe (ZETIA) 10 MG tablet Take 10 mg by mouth daily.     fenofibrate (TRICOR) 145 MG tablet Take 145 mg by mouth daily.      fluticasone (FLONASE) 50 MCG/ACT nasal spray Place 2 sprays into both nostrils daily.     folic acid (FOLVITE) 1 MG tablet Take 1 mg by mouth daily.     hydrochlorothiazide (HYDRODIURIL) 25 MG tablet Take 25 mg by mouth daily.      isosorbide mononitrate (IMDUR) 60 MG 24 hr tablet Take 60 mg by mouth daily.     magnesium oxide (MAG-OX) 400 MG tablet Take 400 mg by mouth 2 (two) times daily.      meloxicam (MOBIC) 7.5 MG tablet Take 1 tablet (7.5 mg total) by mouth daily. 30 tablet 0   montelukast (SINGULAIR) 10 MG tablet Take 10 mg by mouth at bedtime.     nystatin (MYCOSTATIN/NYSTOP) powder APPLY TOPICALLY EVERY 6 HOURS     OXYGEN Inhale 2 L into the lungs at bedtime.     potassium chloride SA (KLOR-CON) 20 MEQ tablet Take 20 mEq by mouth 2 (two) times daily.      risperiDONE (RISPERDAL) 3 MG tablet Take 3 mg by mouth at bedtime.      solifenacin  (VESICARE) 10 MG tablet Take 10 mg by mouth daily.     topiramate (TOPAMAX) 50 MG tablet Take 50 mg by mouth at bedtime.      TOVIAZ 8 MG TB24 tablet Take 8 mg by mouth daily.     venlafaxine XR (EFFEXOR-XR) 150 MG 24 hr capsule Take 300 mg by mouth daily with breakfast.      Vitamin D, Ergocalciferol, (DRISDOL) 1.25 MG (50000 UNIT) CAPS capsule Take 50,000 Units by mouth every 30 (thirty) days.     metoprolol tartrate (LOPRESSOR) 25 MG tablet Take 25 mg by mouth 2 (two) times daily.      nitroGLYCERIN (NITROSTAT) 0.4 MG SL tablet Place 0.4 mg under the tongue every 5 (five) minutes as needed for  chest pain.      No current facility-administered medications for this visit.    PAST MEDICAL HISTORY: Past Medical History:  Diagnosis Date   Anginal pain (HCC)    per patient   Anxiety    per patient    CHF (congestive heart failure) (HCC)    COPD (chronic obstructive pulmonary disease) (HCC)    Coronary artery disease    Depression    Dyspnea    per patient due to COPD, and with walking long distances per patient   Fibromyalgia    History of hiatal hernia    Mitral regurgitation    Moderate MR 03/2019   On supplemental oxygen by nasal cannula    per patient 2L at night   Pneumonia    Rheumatoid arthritis (HCC)    per patient diagnosed within the past 2 years, stated on 12/24/2019   Sleep apnea    per patient diagnosed about 3-4 years ago and does not use a CPAP at night    PAST SURGICAL HISTORY: Past Surgical History:  Procedure Laterality Date   ABDOMINAL HYSTERECTOMY     CARDIAC CATHETERIZATION     CORONARY ANGIOPLASTY     DES RCA x3 01/05/16   KNEE SURGERY Left    TONSILLECTOMY     TOTAL KNEE ARTHROPLASTY Left 12/31/2019   Procedure: LEFT TOTAL KNEE ARTHROPLASTY;  Surgeon: Eldred Manges, MD;  Location: [facility] OR;  Service: Orthopedics;  Laterality: Left;    FAMILY HISTORY: Family History  Problem Relation Age of Onset   Cancer Father     SOCIAL HISTORY: Social History    Socioeconomic History   Marital status: Divorced    Spouse name: Not on file   Number of children: Not on file   Years of education: Not on file   Highest education level: Not on file  Occupational History   Not on file  Tobacco Use   Smoking status: Every Day    Packs/day: 1.00    Types: Cigarettes   Smokeless tobacco: Never  Vaping Use   Vaping Use: Never used  Substance and Sexual Activity   Alcohol use: No   Drug use: Yes    Types: Marijuana    Comment: per patient "maybe 3-4 times a week"   Sexual activity: Not on file  Other Topics Concern   Not on file  Social History Narrative   Not on file   Social Determinants of Health   Financial Resource Strain: Not on file  Food Insecurity: Not on file  Transportation Needs: Not on file  Physical Activity: Not on file  Stress: Not on file  Social Connections: Not on file  Intimate Partner Violence: Not on file    Addendum, laboratory evaluation from [facility] laboratory dated July 05, 2021, hemoglobin of 11.1,  CMP, creatinine of 1.23, normal ferritin 35, folic acid 18.9, ferritin 35, transferrin was elevated at 365, LDL of 46, triglyceride 225, magnesium 1.7, TSH of normal 2.7, B12 of 214,  CT abdomen [facility] Mar 03, 2020, Stable defibrillator,  CT head July 03, 2014, 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 Ph.D.  [facility] [address] [place], [place] [address] Ph: [phone] Fax: [phone]  CC:  Dema Severin, NP [address] [place],  [place] [address]  Elyn Peers,  Orie Rout, NP

## 2021-09-15 NOTE — Telephone Encounter (Signed)
[employer] medicare/medicaid order sent to [facility], [facility] they will reach out to the patient to schedule.  ?

## 2021-09-16 LAB — CBC WITH DIFFERENTIAL
Basophils Absolute: 0 10*3/uL (ref 0.0–0.2)
Basos: 0 %
EOS (ABSOLUTE): 0 10*3/uL (ref 0.0–0.4)
Eos: 0 %
Hematocrit: 31.5 % — ABNORMAL LOW (ref 34.0–46.6)
Hemoglobin: 10.6 g/dL — ABNORMAL LOW (ref 11.1–15.9)
Immature Grans (Abs): 0 10*3/uL (ref 0.0–0.1)
Immature Granulocytes: 0 %
Lymphocytes Absolute: 1.2 10*3/uL (ref 0.7–3.1)
Lymphs: 27 %
MCH: 31.5 pg (ref 26.6–33.0)
MCHC: 33.7 g/dL (ref 31.5–35.7)
MCV: 94 fL (ref 79–97)
Monocytes Absolute: 0.5 10*3/uL (ref 0.1–0.9)
Monocytes: 10 %
Neutrophils Absolute: 2.8 10*3/uL (ref 1.4–7.0)
Neutrophils: 63 %
RBC: 3.37 x10E6/uL — ABNORMAL LOW (ref 3.77–5.28)
RDW: 12.8 % (ref 11.7–15.4)
WBC: 4.5 10*3/uL (ref 3.4–10.8)

## 2021-09-16 LAB — COMPREHENSIVE METABOLIC PANEL
ALT: 7 IU/L (ref 0–32)
AST: 12 IU/L (ref 0–40)
Albumin/Globulin Ratio: 1.1 — ABNORMAL LOW (ref 1.2–2.2)
Albumin: 3.6 g/dL — ABNORMAL LOW (ref 3.8–4.9)
Alkaline Phosphatase: 70 IU/L (ref 44–121)
BUN/Creatinine Ratio: 7 — ABNORMAL LOW (ref 9–23)
BUN: 8 mg/dL (ref 6–24)
Bilirubin Total: 0.3 mg/dL (ref 0.0–1.2)
CO2: 25 mmol/L (ref 20–29)
Calcium: 9.1 mg/dL (ref 8.7–10.2)
Chloride: 102 mmol/L (ref 96–106)
Creatinine, Ser: 1.19 mg/dL — ABNORMAL HIGH (ref 0.57–1.00)
Globulin, Total: 3.2 g/dL (ref 1.5–4.5)
Glucose: 80 mg/dL (ref 70–99)
Potassium: 5.1 mmol/L (ref 3.5–5.2)
Sodium: 137 mmol/L (ref 134–144)
Total Protein: 6.8 g/dL (ref 6.0–8.5)
eGFR: 53 mL/min/{1.73_m2} — ABNORMAL LOW (ref >59)

## 2021-09-16 LAB — VITAMIN B12: Vitamin B-12: 541 pg/mL (ref 232–1245)

## 2021-09-16 LAB — HIV ANTIBODY (ROUTINE TESTING W REFLEX): HIV Screen 4th Generation wRfx: NONREACTIVE

## 2021-09-16 LAB — TSH: TSH: 1.91 u[IU]/mL (ref 0.450–4.500)

## 2021-09-16 LAB — SEDIMENTATION RATE: Sed Rate: 19 mm/h (ref 0–40)

## 2021-09-16 LAB — SYPHILIS: RPR W/REFLEX TO RPR TITER AND TREPONEMAL ANTIBODIES, TRADITIONAL SCREENING AND DIAGNOSIS ALGORITHM: RPR Ser Ql: NONREACTIVE

## 2021-09-16 LAB — C-REACTIVE PROTEIN: CRP: 6 mg/L (ref 0–10)

## 2021-09-19 ENCOUNTER — Telehealth: Payer: Self-pay | Admitting: Neurology

## 2021-09-19 NOTE — Telephone Encounter (Signed)
I spoke to the patient and she verbalized understanding. She will increase her water intake and follow up w/ PCP on these findings.

## 2021-09-19 NOTE — Telephone Encounter (Signed)
Please call patient, laboratory evaluation showed mild elevated creatinine 1.19, indicating mild abnormal kidney function, GFR of 53, she would benefit increase water intake  Mild anemia, hemoglobin of 10.6 this is at her baseline  Rest of the laboratory evaluation showed no significant abnormality, I have forwarded the result to her primary care Dema Severin, NP

## 2021-09-26 ENCOUNTER — Ambulatory Visit (INDEPENDENT_AMBULATORY_CARE_PROVIDER_SITE_OTHER): Payer: Medicare Other | Admitting: Neurology

## 2021-09-26 DIAGNOSIS — R4182 Altered mental status, unspecified: Secondary | ICD-10-CM | POA: Diagnosis not present

## 2021-09-26 DIAGNOSIS — R443 Hallucinations, unspecified: Secondary | ICD-10-CM

## 2021-10-05 ENCOUNTER — Inpatient Hospital Stay: Admission: RE | Admit: 2021-10-05 | Payer: Medicare Other | Source: Ambulatory Visit

## 2021-10-05 NOTE — Procedures (Addendum)
   HISTORY: 55 year old female presenting with worsening visual hallucinations, staring spells   TECHNIQUE:  This is a routine 16 channel EEG recording with one channel devoted to a limited EKG recording.  It was performed during wakefulness, drowsiness and asleep.  Photic stimulation were performed as activating procedures.  There are minimum muscle and movement artifact noted.  Upon maximum arousal, posterior dominant waking rhythm consistent of rhythmic alpha range activity, with frequency of 8 hz. Activities are symmetric over the bilateral posterior derivations and attenuated with eye opening.  Hyperventilation was not performed with her reported history of COPD  Photic stimulation did not alter the tracing.  During EEG recording, patient developed drowsiness and no deeper stage of sleep was achieved  During EEG recording, there was no epileptiform discharge noted.  EKG demonstrate mild irregular heart rate of 72 bpm  CONCLUSION: This is a  normal awake EEG.  There is no electrodiagnostic evidence of epileptiform discharge.  Levert Feinstein, M.D. Ph.D.  [facility] [address] [place], [place] [address] Phone: [phone] Fax:      [phone]

## 2021-10-17 ENCOUNTER — Other Ambulatory Visit: Payer: Self-pay

## 2021-10-17 ENCOUNTER — Ambulatory Visit
Admission: RE | Admit: 2021-10-17 | Discharge: 2021-10-17 | Disposition: A | Discharge status: Home / Self Care | Payer: Medicare Other | Source: Ambulatory Visit | Attending: Neurology | Admitting: Neurology

## 2021-10-17 DIAGNOSIS — R443 Hallucinations, unspecified: Secondary | ICD-10-CM | POA: Diagnosis not present

## 2021-10-17 MED ORDER — GADOBENATE DIMEGLUMINE 529 MG/ML IV SOLN
15.0000 mL | Freq: Once | INTRAVENOUS | Status: AC | PRN
  Administered 2021-10-17: 10:00:00 15 mL via INTRAVENOUS

## 2022-10-03 ENCOUNTER — Other Ambulatory Visit (HOSPITAL_COMMUNITY): Payer: Self-pay | Admitting: Geriatric Medicine

## 2022-10-03 DIAGNOSIS — K257 Chronic gastric ulcer without hemorrhage or perforation: Secondary | ICD-10-CM

## 2022-10-10 ENCOUNTER — Other Ambulatory Visit (HOSPITAL_COMMUNITY): Payer: Medicare Other

## 2022-10-10 ENCOUNTER — Encounter (HOSPITAL_COMMUNITY): Payer: Self-pay

## 2022-12-11 ENCOUNTER — Other Ambulatory Visit: Payer: Self-pay | Admitting: Family

## 2022-12-11 DIAGNOSIS — E2839 Other primary ovarian failure: Secondary | ICD-10-CM

## 2022-12-11 DIAGNOSIS — Z1231 Encounter for screening mammogram for malignant neoplasm of breast: Secondary | ICD-10-CM

## 2022-12-18 ENCOUNTER — Ambulatory Visit: Payer: 59

## 2022-12-18 ENCOUNTER — Other Ambulatory Visit: Payer: 59

## 2023-01-12 ENCOUNTER — Ambulatory Visit
Admission: RE | Admit: 2023-01-12 | Discharge: 2023-01-12 | Disposition: A | Discharge status: Home / Self Care | Payer: 59 | Source: Ambulatory Visit | Attending: Family | Admitting: Family

## 2023-01-12 DIAGNOSIS — Z1231 Encounter for screening mammogram for malignant neoplasm of breast: Secondary | ICD-10-CM

## 2023-05-08 ENCOUNTER — Other Ambulatory Visit (INDEPENDENT_AMBULATORY_CARE_PROVIDER_SITE_OTHER): Payer: 59

## 2023-05-08 ENCOUNTER — Ambulatory Visit (INDEPENDENT_AMBULATORY_CARE_PROVIDER_SITE_OTHER): Payer: 59 | Admitting: Orthopaedic Surgery

## 2023-05-08 VITALS — BP 88/63 | HR 81

## 2023-05-08 DIAGNOSIS — G8929 Other chronic pain: Secondary | ICD-10-CM

## 2023-05-08 DIAGNOSIS — M25561 Pain in right knee: Secondary | ICD-10-CM

## 2023-05-08 DIAGNOSIS — M1711 Unilateral primary osteoarthritis, right knee: Secondary | ICD-10-CM

## 2023-05-08 NOTE — Progress Notes (Signed)
Office Visit Note   Patient: Lydia [name]           Date of Birth: 1966-10-06           MRN: [id number] Visit Date: 05/08/2023              Requested by: Erskine Emery, NP [address] [place],  [place] [address] PCP: Erskine Emery, NP   Assessment & Plan: Visit Diagnoses:  1. Chronic pain of right knee   2. Unilateral primary osteoarthritis, right knee     Plan: Patient like to proceed with total knee arthroplasty right knee.  She has failed conservative treatment.  She need cardiac clearance with her cardiac history.  We discussed adductor block spinal anesthesia, Exparel and Marcaine infiltration overnight stay in the hospital as she did for her other knee.  We placed her on telemetry if cardiology feels this is recommendation.  Questions were elicited and answered.  Follow-Up Instructions: No follow-ups on file.   Orders:  Orders Placed This Encounter  Procedures   XR KNEE 3 VIEW RIGHT   No orders of the defined types were placed in this encounter.     Procedures: No procedures performed   Clinical Data: No additional findings.   Subjective: Chief Complaint  Patient presents with   Right Knee - Pain    HPI 57 year old female returns with progressive right knee osteoarthritis with failure to respond anti-inflammatories, Tylenol, topical rubs, use of a cane.  She had previous left total knee arthroplasty by me 3 years ago doing well full range of motion and no pain.  Patient states the right knee has progressed the injection has not helped and now she is ready to schedule total knee arthroplasty.  Patient uses some oxygen at night.  She has been on Mobic for the arthritis.  Previous history of atrial flutter she was on Eliquis at 1 point.  Had mitral valve replacement atrial flutter with ablation treatment 02/14/2023.  Patient sees [facility] for cardiology.  No current chest pain.  Review of Systems positive COPD, atrial flutter with ablation  previous median sternotomy with mitral valve replacement.  Nighttime oxygen.   Objective: Vital Signs: BP (!) 88/63   Pulse 81   Physical Exam Constitutional:      Appearance: She is well-developed.  HENT:     Head: Normocephalic.     Right Ear: External ear normal.     Left Ear: External ear normal. There is no impacted cerumen.  Eyes:     Pupils: Pupils are equal, round, and reactive to light.  Neck:     Thyroid: No thyromegaly.     Trachea: No tracheal deviation.  Cardiovascular:     Rate and Rhythm: Normal rate.  Pulmonary:     Effort: Pulmonary effort is normal.  Abdominal:     Palpations: Abdomen is soft.  Musculoskeletal:     Cervical back: No rigidity.  Skin:    General: Skin is warm and dry.  Neurological:     Mental Status: She is alert and oriented to person, place, and time.  Psychiatric:        Behavior: Behavior normal.     Ortho Exam negative logroll right and left hip well-healed midline left knee incision.  Right knee crepitus knee range of motion she lacks 5 degrees reaching full extension flexes to 95 degrees.  Collateral ligaments are balanced.  More medial than lateral joint line tenderness palpable lateral osteophytes.  Specialty Comments:  No specialty comments available.  Imaging: No results found.   PMFS History: Patient Active Problem List   Diagnosis Date Noted   Unilateral primary osteoarthritis, right knee 05/08/2023   Hallucination 09/15/2021   Depression 09/15/2021   Other intervertebral disc degeneration, lumbar region 04/14/2021   S/P total knee arthroplasty 01/01/2020   CAD (coronary artery disease) 02/01/2016   Sepsis (HCC) 01/29/2016   Respiratory failure (HCC) 01/29/2016   Influenza    Chronic obstructive pulmonary disease with acute exacerbation (HCC)    Cough    Past Medical History:  Diagnosis Date   Anginal pain (HCC)    per patient   Anxiety    per patient    CHF (congestive heart failure) (HCC)    COPD  (chronic obstructive pulmonary disease) (HCC)    Coronary artery disease    Depression    Dyspnea    per patient due to COPD, and with walking long distances per patient   Fibromyalgia    History of hiatal hernia    Mitral regurgitation    Moderate MR 03/2019   On supplemental oxygen by nasal cannula    per patient 2L at night   Pneumonia    Rheumatoid arthritis (HCC)    per patient diagnosed within the past 2 years, stated on 12/24/2019   Sleep apnea    per patient diagnosed about 3-4 years ago and does not use a CPAP at night    Family History  Problem Relation Age of Onset   Cancer Father    Breast cancer Neg Hx     Past Surgical History:  Procedure Laterality Date   ABDOMINAL HYSTERECTOMY     CARDIAC CATHETERIZATION     CORONARY ANGIOPLASTY     DES RCA x3 01/05/16   KNEE SURGERY Left    TONSILLECTOMY     TOTAL KNEE ARTHROPLASTY Left 12/31/2019   Procedure: LEFT TOTAL KNEE ARTHROPLASTY;  Surgeon: Eldred Manges, MD;  Location: [facility] OR;  Service: Orthopedics;  Laterality: Left;   Social History   Occupational History   Not on file  Tobacco Use   Smoking status: Every Day    Packs/day: 1    Types: Cigarettes   Smokeless tobacco: Never  Vaping Use   Vaping Use: Never used  Substance and Sexual Activity   Alcohol use: No   Drug use: Yes    Types: Marijuana    Comment: per patient "maybe 3-4 times a week"   Sexual activity: Not on file

## 2023-06-28 ENCOUNTER — Other Ambulatory Visit: Payer: Self-pay | Admitting: Physician Assistant

## 2023-07-05 NOTE — Pre-Procedure Instructions (Addendum)
Lydia [name]  07/05/2023    Your procedure is scheduled on Wednesday, September 18th  Report to [facility] Admitting at 1:45 PM  Call this number if you have problems the morning of surgery:  [phone] pre surgery desk  If you experience any cold or flu symptoms such as cough, fever, chills, shortness of breath, etc. between now and your scheduled surgery, please notify us at the above number.  For questions prior to the day of surgery, Monday - Friday, call [phone] PAT desk ask for any nurse.   Remember:  Do not eat midnight Tuesday September 17.  You may drink clear liquids until 1245 .  Clear liquids allowed are:                    Water, Juice (No red color; non-citric and without pulp; diabetics please choose diet or no sugar options), Carbonated beverages (diabetics please choose diet or no sugar options), Clear Tea (No creamer, milk, or cream, including half & half and powdered creamer), Black Coffee Only (No creamer, milk or cream, including half & half and powdered creamer), Plain Jell-O Only (No red color; diabetics please choose no sugar options), Clear Sports drink (No red color; diabetics please choose diet or no sugar options), and Plain Popsicles Only (No red color; diabetics please choose no sugar options)   Drink the Pre- Surgery Ensure between 11:45 - 12:45, nothing else to drink after 12:45.  Take these medicines the morning of surgery with A SIP OF WATER: amLODipine (NORVASC)   BREZTRI AEROSPHERE  ezetimibe (ZETIA) famotidine (PEPCID) fenofibrate (TRICOR) metoprolol tartrate (LOPRESSOR)  pantoprazole (PROTONIX)  venlafaxine XR (EFFEXOR-XR)   TAKE IF NEEDED: ondansetron (ZOFRAN-ODT)  nitroGLYCERIN (NITROSTAT) THEN CALL EMS albuterol (PROVENTIL HFA;VENTOLIN HFA)     One week prior to surgery, STOP taking any Aspirin (unless otherwise instructed by your surgeon) Aleve, Naproxen, Ibuprofen, Motrin, Advil, Goody's, BC's, all herbal  medications, fish oil, and non-prescription vitamins. Pre-operative 5 CHG Bathing Instructions   You can play a key role in reducing the risk of infection after surgery. Your skin needs to be as free of germs as possible. You can reduce the number of germs on your skin by washing with CHG (chlorhexidine gluconate) soap before surgery. CHG is an antiseptic soap that kills germs and continues to kill germs even after washing.   DO NOT use if you have an allergy to chlorhexidine/CHG or antibacterial soaps. If your skin becomes reddened or irritated, stop using the CHG and notify one of our RNs at [phone].   Please shower with the CHG soap starting 4 days before surgery using the following schedule:     Please keep in mind the following:  DO NOT shave, including legs and underarms, starting the day of your first shower.   You may shave your face at any point before/day of surgery.  Place clean sheets on your bed the day you start using CHG soap. Use a clean washcloth (not used since being washed) for each shower. DO NOT sleep with pets once you start using the CHG.   CHG Shower Instructions:  If you choose to wash your hair and private area, wash first with your normal shampoo/soap.  After you use shampoo/soap, rinse your hair and body thoroughly to remove shampoo/soap residue.  Turn the water OFF and apply about 3 tablespoons (45 ml) of CHG soap to a CLEAN washcloth.  Apply CHG soap ONLY FROM YOUR NECK DOWN TO  YOUR TOES (washing for 3-5 minutes)  DO NOT use CHG soap on face, private areas, open wounds, or sores.  Pay special attention to the area where your surgery is being performed.  If you are having back surgery, having someone wash your back for you may be helpful. Wait 2 minutes after CHG soap is applied, then you may rinse off the CHG soap.  Pat dry with a clean towel  Put on clean clothes/pajamas   If you choose to wear lotion, please use ONLY the CHG-compatible lotions on the  back of this paper.   Additional instructions for the day of surgery: DO NOT APPLY any lotions, deodorants, cologne, or perfumes.   Do not bring valuables to the hospital. [facility] is not responsible for any belongings/valuables. Do not wear nail polish, gel polish, artificial nails, or any other type of covering on natural nails (fingers and toes) Do not wear jewelry or makeup Put on clean/comfortable clothes.  Please brush your teeth.  Ask your nurse before applying any prescription medications to the skin.    CHG Compatible Lotions   Aveeno Moisturizing lotion  Cetaphil Moisturizing Cream  Cetaphil Moisturizing Lotion  Clairol Herbal Essence Moisturizing Lotion, Dry Skin  Clairol Herbal Essence Moisturizing Lotion, Extra Dry Skin  Clairol Herbal Essence Moisturizing Lotion, Normal Skin  Curel Age Defying Therapeutic Moisturizing Lotion with Alpha Hydroxy  Curel Extreme Care Body Lotion  Curel Soothing Hands Moisturizing Hand Lotion  Curel Therapeutic Moisturizing Cream, Fragrance-Free  Curel Therapeutic Moisturizing Lotion, Fragrance-Free  Curel Therapeutic Moisturizing Lotion, Original Formula  Eucerin Daily Replenishing Lotion  Eucerin Dry Skin Therapy Plus Alpha Hydroxy Crme  Eucerin Dry Skin Therapy Plus Alpha Hydroxy Lotion  Eucerin Original Crme  Eucerin Original Lotion  Eucerin Plus Crme Eucerin Plus Lotion  Eucerin TriLipid Replenishing Lotion  Keri Anti-Bacterial Hand Lotion  Keri Deep Conditioning Original Lotion Dry Skin Formula Softly Scented  Keri Deep Conditioning Original Lotion, Fragrance Free Sensitive Skin Formula  Keri Lotion Fast Absorbing Fragrance Free Sensitive Skin Formula  Keri Lotion Fast Absorbing Softly Scented Dry Skin Formula  Keri Original Lotion  Keri Skin Renewal Lotion Keri Silky Smooth Lotion  Keri Silky Smooth Sensitive Skin Lotion  Nivea Body Creamy Conditioning Oil  Nivea Body Extra Enriched Lotion  Nivea Body Original  Lotion  Nivea Body Sheer Moisturizing Lotion Nivea Crme  Nivea Skin Firming Lotion  NutraDerm 30 Skin Lotion  NutraDerm Skin Lotion  NutraDerm Therapeutic Skin Cream  NutraDerm Therapeutic Skin Lotion  ProShield Protective Hand Cream  Provon moisturizing lotion   Do not wear jewelry, make-up or nail polish, including gel polish,  artificial nails, or any other type of covering on natural nails (fingers and  toes).  Do not wear lotions, powders, or perfumes, or deodorant.  Do not shave 48 hours prior to surgery.  Men may shave face and neck.  Do not bring valuables to the hospital.  [facility] is not responsible for any belongings or valuables.  Contacts, dentures or bridgework may not be worn into surgery.  Leave your suitcase in the car.  After surgery it may be brought to your room.  Do NOT Smoke (Tobacco/Vaping) for 24 hours prior to your procedure.  If you use a CPAP at night, you may bring your mask/headgear for your overnight stay.   You will be asked to remove any contacts, glasses, piercing's, hearing aid's, dentures/partials prior to surgery. Please bring cases for these items if needed.    Patients discharged the  day of surgery will not be allowed to drive home, and someone needs to stay with them for 24 hours.  SURGICAL WAITING ROOM VISITATION Patients may have no more than 2 support people in the waiting area - these visitors may rotate.   Pre-op nurse will coordinate an appropriate time for 1 ADULT support person, who may not rotate, to accompany patient in pre-op.  Children under the age of 47 must have an adult with them who is not the patient and must remain in the main waiting area with an adult.  If the patient needs to stay at the hospital during part of their recovery, the visitor guidelines for inpatient rooms apply.  Please refer to the [facility] website for the visitor guidelines for any additional information.  If you received a COVID test during your  pre-op visit  it is requested that you wear a mask when out in public, stay away from anyone that may not be feeling well and notify your surgeon if you develop symptoms. If you have been in contact with anyone that has tested positive in the last 10 days please notify you surgeon.   Patients discharged the day of surgery will not be allowed to drive home.   Please read over the  fact sheets that you were given.

## 2023-07-06 ENCOUNTER — Other Ambulatory Visit: Payer: Self-pay | Admitting: Physician Assistant

## 2023-07-06 ENCOUNTER — Other Ambulatory Visit: Payer: Self-pay

## 2023-07-06 ENCOUNTER — Encounter (HOSPITAL_COMMUNITY)
Admission: RE | Admit: 2023-07-06 | Discharge: 2023-07-06 | Disposition: A | Discharge status: Home / Self Care | Payer: 59 | Source: Ambulatory Visit | Attending: Orthopaedic Surgery | Admitting: Orthopaedic Surgery

## 2023-07-06 ENCOUNTER — Ambulatory Visit (INDEPENDENT_AMBULATORY_CARE_PROVIDER_SITE_OTHER): Payer: 59 | Admitting: Physician Assistant

## 2023-07-06 ENCOUNTER — Encounter (HOSPITAL_COMMUNITY): Payer: Self-pay

## 2023-07-06 ENCOUNTER — Encounter: Payer: Self-pay | Admitting: Physician Assistant

## 2023-07-06 VITALS — BP 102/67 | HR 70 | Temp 98.1°F | Resp 18 | Ht 62.0 in | Wt 178.0 lb

## 2023-07-06 VITALS — BP 117/81 | HR 62 | Temp 98.7°F | Ht 60.5 in | Wt 178.4 lb

## 2023-07-06 DIAGNOSIS — Z01818 Encounter for other preprocedural examination: Secondary | ICD-10-CM

## 2023-07-06 DIAGNOSIS — Z01812 Encounter for preprocedural laboratory examination: Secondary | ICD-10-CM | POA: Insufficient documentation

## 2023-07-06 DIAGNOSIS — M1711 Unilateral primary osteoarthritis, right knee: Secondary | ICD-10-CM

## 2023-07-06 HISTORY — DX: Essential (primary) hypertension: I10

## 2023-07-06 HISTORY — DX: Bipolar disorder, unspecified: F31.9

## 2023-07-06 HISTORY — DX: Cardiac arrhythmia, unspecified: I49.9

## 2023-07-06 HISTORY — DX: Unspecified asthma, uncomplicated: J45.909

## 2023-07-06 HISTORY — DX: Gastro-esophageal reflux disease without esophagitis: K21.9

## 2023-07-06 HISTORY — DX: Cardiac murmur, unspecified: R01.1

## 2023-07-06 HISTORY — DX: Personal history of other medical treatment: Z92.89

## 2023-07-06 HISTORY — DX: Anemia, unspecified: D64.9

## 2023-07-06 LAB — BASIC METABOLIC PANEL
Anion gap: 10 (ref 5–15)
BUN: <5 mg/dL — ABNORMAL LOW (ref 6–20)
CO2: 25 mmol/L (ref 22–32)
Calcium: 8.6 mg/dL — ABNORMAL LOW (ref 8.9–10.3)
Chloride: 103 mmol/L (ref 98–111)
Creatinine, Ser: 0.96 mg/dL (ref 0.44–1.00)
GFR, Estimated: >60 mL/min (ref >60)
Glucose, Bld: 100 mg/dL — ABNORMAL HIGH (ref 70–99)
Potassium: 4.5 mmol/L (ref 3.5–5.1)
Sodium: 138 mmol/L (ref 135–145)

## 2023-07-06 LAB — CBC
HCT: 39.9 % (ref 36.0–46.0)
Hemoglobin: 12.6 g/dL (ref 12.0–15.0)
MCH: 32.9 pg (ref 26.0–34.0)
MCHC: 31.6 g/dL (ref 30.0–36.0)
MCV: 104.2 fL — ABNORMAL HIGH (ref 80.0–100.0)
Platelets: 231 10*3/uL (ref 150–400)
RBC: 3.83 MIL/uL — ABNORMAL LOW (ref 3.87–5.11)
RDW: 14.3 % (ref 11.5–15.5)
WBC: 6.7 10*3/uL (ref 4.0–10.5)
nRBC: 0 % (ref 0.0–0.2)

## 2023-07-06 LAB — SURGICAL PCR SCREEN
MRSA, PCR: NEGATIVE
Staphylococcus aureus: NEGATIVE

## 2023-07-06 NOTE — Progress Notes (Signed)
Office Visit Note   Patient: Lydia [name]           Date of Birth: Dec 02, 1965           MRN: [id number] Visit Date: 07/06/2023              Requested by: Erskine Emery, NP [address] [place],  [place] [address] PCP: Erskine Emery, NP  Chief Complaint  Patient presents with   Right Knee - Pain    Pre Op      HPI: Patient is a 57 year old woman who presents today for preoperative history and physical for upcoming right total knee arthroplasty with Dr. Ophelia Charter.  She has discussed this with him.  Her health is stable.  She has failed conservative treatment she still continues to smoke a pack of cigarettes daily.  She did recover from her left knee replacement without difficulty a couple years ago.  She does have a history of stents and is on an anticoagulant  Assessment & Plan: Visit Diagnoses: Osteoarthritis right knee  Plan: Reviewed the risks of surgery with her which include but are not limited to bleeding, infection, anesthesia complications, need for future surgery.  These have also been discussed with Dr. Ophelia Charter.  Full H&P is dictated in the hospital system she does have a history of being on Eliquis in the past year since her previous surgery.  Will discuss with Dr. Ophelia Charter timing of discontinuing of this and contact her Follow-Up Instructions: 1 week postop  Ortho Exam  Patient is alert, oriented, no adenopathy, well-dressed, normal affect, normal respiratory effort.  Ortho Exam negative logroll right and left hip well-healed midline left knee incision.  Right knee crepitus knee range of motion she lacks 5 degrees reaching full extension flexes to 95 degrees.  Collateral ligaments are balanced.  More medial than lateral joint line tenderness palpable lateral osteophytes.  Imaging: No results found. No images are attached to the encounter.  Labs: Lab Results  Component Value Date   ESRSEDRATE 19 09/15/2021   CRP 6 09/15/2021   REPTSTATUS 02/03/2016 FINAL 01/29/2016    CULT  01/29/2016    NO GROWTH 5 DAYS Performed at [facility]      Lab Results  Component Value Date   ALBUMIN 3.6 (L) 09/15/2021   ALBUMIN 3.6 12/24/2019   ALBUMIN 3.0 (L) 01/30/2016    Lab Results  Component Value Date   MG 1.9 11/07/2010   No results found for: "VD25OH"  No results found for: "PREALBUMIN"    Latest Ref Rng & Units 09/15/2021   10:21 AM 01/02/2020    5:39 AM 01/01/2020    3:48 AM  CBC EXTENDED  WBC 3.4 - 10.8 x10E3/uL 4.5  6.3  8.1   RBC 3.77 - 5.28 x10E6/uL 3.37  3.12  3.53   Hemoglobin 11.1 - 15.9 g/dL 63.8  9.7  75.6   HCT 43.3 - 46.6 % 31.5  29.9  34.3   Platelets 150 - 400 K/uL  247  282   NEUT# 1.4 - 7.0 x10E3/uL 2.8     Lymph# 0.7 - 3.1 x10E3/uL 1.2        Body mass index is 34.27 kg/m.  Orders:  No orders of the defined types were placed in this encounter.  No orders of the defined types were placed in this encounter.    Procedures: No procedures performed  Clinical Data: No additional findings.  ROS:  All other systems negative, except as noted  in the HPI. Review of Systems  Objective: Vital Signs: BP 117/81 (BP Location: Left Arm, Patient Position: Sitting, Cuff Size: Normal)   Pulse 62   Temp 98.7 F (37.1 C) (Oral)   Ht 5' 0.5" (1.537 m)   Wt 178 lb 6.4 oz (80.9 kg)   BMI 34.27 kg/m   Specialty Comments:  No specialty comments available.  PMFS History: Patient Active Problem List   Diagnosis Date Noted   Unilateral primary osteoarthritis, right knee 05/08/2023   Hallucination 09/15/2021   Depression 09/15/2021   Other intervertebral disc degeneration, lumbar region 04/14/2021   S/P total knee arthroplasty 01/01/2020   CAD (coronary artery disease) 02/01/2016   Sepsis (HCC) 01/29/2016   Respiratory failure (HCC) 01/29/2016   Influenza    Chronic obstructive pulmonary disease with acute exacerbation (HCC)    Cough    Past Medical History:  Diagnosis Date   Anemia    Anginal pain (HCC)    per  patient   Anxiety    per patient    Asthma    Bipolar disorder (HCC)    CHF (congestive heart failure) (HCC)    COPD (chronic obstructive pulmonary disease) (HCC)    Coronary artery disease    Depression    Dyspnea    per patient due to COPD, and with walking long distances per patient   Dysrhythmia    Fibromyalgia    GERD (gastroesophageal reflux disease)    Heart murmur    History of blood transfusion    History of hiatal hernia    Hypertension    Mitral regurgitation    Moderate MR 03/2019   On supplemental oxygen by nasal cannula    per patient 2L at night   Pneumonia    Rheumatoid arthritis (HCC)    per patient diagnosed within the past 2 years, stated on 12/24/2019   Sleep apnea    per patient diagnosed about 3-4 years ago and does not use a CPAP at night    Family History  Problem Relation Age of Onset   Cancer Father    Breast cancer Neg Hx     Past Surgical History:  Procedure Laterality Date   ABDOMINAL HYSTERECTOMY     CARDIAC CATHETERIZATION     CORONARY ANGIOPLASTY     DES RCA x3 01/05/16   KNEE SURGERY Left    TONSILLECTOMY     TOTAL KNEE ARTHROPLASTY Left 12/31/2019   Procedure: LEFT TOTAL KNEE ARTHROPLASTY;  Surgeon: Eldred Manges, MD;  Location: [facility] OR;  Service: Orthopedics;  Laterality: Left;   Social History   Occupational History   Not on file  Tobacco Use   Smoking status: Every Day    Current packs/day: 1.00    Types: Cigarettes   Smokeless tobacco: Never  Vaping Use   Vaping status: Never Used  Substance and Sexual Activity   Alcohol use: No   Drug use: Yes    Frequency: 7.0 times per week    Types: Marijuana    Comment: last time was 07/04/23-   Sexual activity: Not on file

## 2023-07-06 NOTE — H&P (Signed)
TOTAL KNEE ADMISSION H&P  Patient is being admitted for right total knee arthroplasty.  Subjective:  Chief Complaint:right knee pain.  HPI: Lydia [name], 57 y.o. female, has a history of pain and functional disability in the right knee due to arthritis and has failed non-surgical conservative treatments for greater than 12 weeks to includecorticosteriod injections, viscosupplementation injections, and use of assistive devices.  Onset of symptoms was gradual, starting 5 years ago with gradually worsening course since that time. The patient noted no past surgery on the right knee(s).  Patient currently rates pain in the right knee(s) at 7 out of 10 with activity. Patient has night pain, pain with passive range of motion, and joint swelling.  Patient has evidence of subchondral cysts and periarticular osteophytes by imaging studies. This patient has had  There is no active infection.  Patient Active Problem List   Diagnosis Date Noted   Unilateral primary osteoarthritis, right knee 05/08/2023   Hallucination 09/15/2021   Depression 09/15/2021   Other intervertebral disc degeneration, lumbar region 04/14/2021   S/P total knee arthroplasty 01/01/2020   CAD (coronary artery disease) 02/01/2016   Sepsis (HCC) 01/29/2016   Respiratory failure (HCC) 01/29/2016   Influenza    Chronic obstructive pulmonary disease with acute exacerbation (HCC)    Cough    Past Medical History:  Diagnosis Date   Anemia    Anginal pain (HCC)    per patient   Anxiety    per patient    Asthma    Bipolar disorder (HCC)    CHF (congestive heart failure) (HCC)    COPD (chronic obstructive pulmonary disease) (HCC)    Coronary artery disease    Depression    Dyspnea    per patient due to COPD, and with walking long distances per patient   Dysrhythmia    Fibromyalgia    GERD (gastroesophageal reflux disease)    Heart murmur    History of blood transfusion    History of hiatal hernia    Hypertension     Mitral regurgitation    Moderate MR 03/2019   On supplemental oxygen by nasal cannula    per patient 2L at night   Pneumonia    Rheumatoid arthritis (HCC)    per patient diagnosed within the past 2 years, stated on 12/24/2019   Sleep apnea    per patient diagnosed about 3-4 years ago and does not use a CPAP at night    Past Surgical History:  Procedure Laterality Date   ABDOMINAL HYSTERECTOMY     CARDIAC CATHETERIZATION     CORONARY ANGIOPLASTY     DES RCA x3 01/05/16   KNEE SURGERY Left    TONSILLECTOMY     TOTAL KNEE ARTHROPLASTY Left 12/31/2019   Procedure: LEFT TOTAL KNEE ARTHROPLASTY;  Surgeon: Eldred Manges, MD;  Location: [facility] OR;  Service: Orthopedics;  Laterality: Left;    Current Outpatient Medications  Medication Sig Dispense Refill Last Dose   albuterol (PROVENTIL HFA;VENTOLIN HFA) 108 (90 Base) MCG/ACT inhaler Inhale 2 puffs into the lungs every 6 (six) hours as needed for wheezing or shortness of breath.       amLODipine (NORVASC) 2.5 MG tablet Take 2.5 mg by mouth daily.      apixaban (ELIQUIS) 5 MG TABS tablet Take 5 mg by mouth 2 (two) times daily.      aspirin EC 325 MG EC tablet Take 1 tablet (325 mg total) by mouth daily with breakfast. 30 tablet 0  atorvastatin (LIPITOR) 80 MG tablet Take 80 mg by mouth daily.      benztropine (COGENTIN) 0.5 MG tablet Take 0.5 mg by mouth 2 (two) times daily.       BREZTRI AEROSPHERE 160-9-4.8 MCG/ACT AERO Inhale 2 puffs into the lungs 2 (two) times daily.      Buprenorphine HCl-Naloxone HCl 8-2 MG FILM Place 1 Film under the tongue in the morning and at bedtime.       cetirizine (ZYRTEC) 10 MG tablet Take 10 mg by mouth at bedtime.      cyclobenzaprine (FLEXERIL) 10 MG tablet Take 10 mg by mouth 3 (three) times daily as needed for muscle spasms.      ezetimibe (ZETIA) 10 MG tablet Take 10 mg by mouth daily.      famotidine (PEPCID) 20 MG tablet Take 20 mg by mouth 2 (two) times daily.      fenofibrate (TRICOR) 145 MG tablet Take 145  mg by mouth daily.       ferrous sulfate 325 (65 FE) MG tablet Take 325 mg by mouth daily.      fluticasone (FLONASE) 50 MCG/ACT nasal spray Place 2 sprays into both nostrils daily as needed for allergies.      folic acid (FOLVITE) 1 MG tablet Take 1 mg by mouth daily.      hydrochlorothiazide (HYDRODIURIL) 25 MG tablet Take 25 mg by mouth daily.       magnesium oxide (MAG-OX) 400 MG tablet Take 400 mg by mouth 2 (two) times daily.       metoprolol tartrate (LOPRESSOR) 25 MG tablet Take 25 mg by mouth 2 (two) times daily.       nitroGLYCERIN (NITROSTAT) 0.4 MG SL tablet Place 0.4 mg under the tongue every 5 (five) minutes as needed for chest pain.       nystatin (MYCOSTATIN/NYSTOP) powder Apply 1 Application topically 3 (three) times daily as needed (irritation).      ondansetron (ZOFRAN-ODT) 4 MG disintegrating tablet Take 4 mg by mouth every 8 (eight) hours as needed for nausea or vomiting.      OXYGEN Inhale 4 L into the lungs at bedtime.      pantoprazole (PROTONIX) 40 MG tablet Take 40 mg by mouth 2 (two) times daily.      potassium chloride SA (KLOR-CON) 20 MEQ tablet Take 20 mEq by mouth 2 (two) times daily.       Rimegepant Sulfate (NURTEC) 75 MG TBDP Take 75 mg by mouth daily as needed (migraines).      risperiDONE (RISPERDAL) 3 MG tablet Take 3 mg by mouth at bedtime.       sucralfate (CARAFATE) 1 g tablet Take 1 g by mouth 3 (three) times daily.      venlafaxine XR (EFFEXOR-XR) 150 MG 24 hr capsule Take 300 mg by mouth daily with breakfast.       Vitamin D, Ergocalciferol, (DRISDOL) 1.25 MG (50000 UNIT) CAPS capsule Take 50,000 Units by mouth every 30 (thirty) days.      No current facility-administered medications for this visit.   Allergies  Allergen Reactions   Nsaids Nausea Only and Other (See Comments)    Heartburn    Social History   Tobacco Use   Smoking status: Every Day    Current packs/day: 1.00    Types: Cigarettes   Smokeless tobacco: Never  Substance Use  Topics   Alcohol use: No    Family History  Problem Relation Age of Onset  Cancer Father    Breast cancer Neg Hx      Review of Systems  All other systems reviewed and are negative.   Objective:  Physical Exam Expand All Collapse All     Office Visit Note              Patient: Lydia [name]                                           Date of Birth: 07-27-1966                                                    MRN: [id number] Visit Date: 05/08/2023                                                                     Requested by: Erskine Emery, NP [address] [place],  [place] [address] PCP: Erskine Emery, NP     Assessment & Plan: Visit Diagnoses:  1. Chronic pain of right knee   2. Unilateral primary osteoarthritis, right knee       Plan: Patient like to proceed with total knee arthroplasty right knee.  She has failed conservative treatment.  She need cardiac clearance with her cardiac history.  We discussed adductor block spinal anesthesia, Exparel and Marcaine infiltration overnight stay in the hospital as she did for her other knee.  We placed her on telemetry if cardiology feels this is recommendation.  Questions were elicited and answered.   Follow-Up Instructions: No follow-ups on file.    Orders:     Orders Placed This Encounter  Procedures   XR KNEE 3 VIEW RIGHT    No orders of the defined types were placed in this encounter.        Procedures: No procedures performed     Clinical Data: No additional findings.     Subjective:    Chief Complaint  Patient presents with   Right Knee - Pain      HPI 57 year old female returns with progressive right knee osteoarthritis with failure to respond anti-inflammatories, Tylenol, topical rubs, use of a cane.  She had previous left total knee arthroplasty by me 3 years ago doing well full range of motion and no pain.  Patient states the right knee has progressed the injection has not helped and now she is  ready to schedule total knee arthroplasty.   Patient uses some oxygen at night.  She has been on Mobic for the arthritis.  Previous history of atrial flutter she was on Eliquis at 1 point.  Had mitral valve replacement atrial flutter with ablation treatment 02/14/2023.  Patient sees [facility] for cardiology.  No current chest pain.   Review of Systems positive COPD, atrial flutter with ablation previous median sternotomy with mitral valve replacement.  Nighttime oxygen.     Objective: Vital Signs: BP (!) 88/63   Pulse 81    Physical Exam Constitutional:  Appearance: She is well-developed.  HENT:     Head: Normocephalic.     Right Ear: External ear normal.     Left Ear: External ear normal. There is no impacted cerumen.  Eyes:     Pupils: Pupils are equal, round, and reactive to light.  Neck:     Thyroid: No thyromegaly.     Trachea: No tracheal deviation.  Cardiovascular:     Rate and Rhythm: Normal rate.  Pulmonary:     Effort: Pulmonary effort is normal.  Abdominal:     Palpations: Abdomen is soft.  Musculoskeletal:     Cervical back: No rigidity.  Skin:    General: Skin is warm and dry.  Neurological:     Mental Status: She is alert and oriented to person, place, and time.  Psychiatric:        Behavior: Behavior normal.    Ortho Exam negative logroll right and left hip well-healed midline left knee incision.  Right knee crepitus knee range of motion she lacks 5 degrees reaching full extension flexes to 95 degrees.  Collateral ligaments are balanced.  More medial than lateral joint line tenderness palpable lateral osteophytes  Vital signs in last 24 hours: @VSRANGES @  Labs:   Estimated body mass index is 34.27 kg/m as calculated from the following:   Height as of an earlier encounter on 07/06/23: 5' 0.5" (1.537 m).   Weight as of an earlier encounter on 07/06/23: 80.9 kg.   Imaging Review Plain radiographs demonstrate moderate degenerative joint  disease of the right knee(s). The overall alignment isneutral. The bone quality appears to be fair for age and reported activity level.      Assessment/Plan:  End stage arthritis, right knee   The patient history, physical examination, clinical judgment of the provider and imaging studies are consistent with end stage degenerative joint disease of the right knee(s) and total knee arthroplasty is deemed medically necessary. The treatment options including medical management, injection therapy arthroscopy and arthroplasty were discussed at length. The risks and benefits of total knee arthroplasty were presented and reviewed. The risks due to aseptic loosening, infection, stiffness, patella tracking problems, thromboembolic complications and other imponderables were discussed. The patient acknowledged the explanation, agreed to proceed with the plan and consent was signed. Patient is being admitted for inpatient treatment for surgery, pain control, PT, OT, prophylactic antibiotics, VTE prophylaxis, progressive ambulation and ADL's and discharge planning. The patient is planning to be discharged home with home health services     Patient's anticipated LOS is less than 2 midnights, meeting these requirements: - Younger than 32 - Lives within 1 hour of care - Has a competent adult at home to recover with post-op recover - NO history of  - Chronic pain requiring opiods  - Diabetes  - Coronary Artery Disease  - Heart failure  - Heart attack  - Stroke  - DVT/VTE  - Cardiac arrhythmia  - Respiratory Failure/COPD  - Renal failure  - Anemia  - Advanced Liver disease

## 2023-07-06 NOTE — H&P (View-Only) (Signed)
TOTAL KNEE ADMISSION H&P  Patient is being admitted for right total knee arthroplasty.  Subjective:  Chief Complaint:right knee pain.  HPI: Lydia [name], 57 y.o. female, has a history of pain and functional disability in the right knee due to arthritis and has failed non-surgical conservative treatments for greater than 12 weeks to includecorticosteriod injections, viscosupplementation injections, and use of assistive devices.  Onset of symptoms was gradual, starting 5 years ago with gradually worsening course since that time. The patient noted no past surgery on the right knee(s).  Patient currently rates pain in the right knee(s) at 7 out of 10 with activity. Patient has night pain, pain with passive range of motion, and joint swelling.  Patient has evidence of subchondral cysts and periarticular osteophytes by imaging studies. This patient has had  There is no active infection.  Patient Active Problem List   Diagnosis Date Noted   Unilateral primary osteoarthritis, right knee 05/08/2023   Hallucination 09/15/2021   Depression 09/15/2021   Other intervertebral disc degeneration, lumbar region 04/14/2021   S/P total knee arthroplasty 01/01/2020   CAD (coronary artery disease) 02/01/2016   Sepsis (HCC) 01/29/2016   Respiratory failure (HCC) 01/29/2016   Influenza    Chronic obstructive pulmonary disease with acute exacerbation (HCC)    Cough    Past Medical History:  Diagnosis Date   Anemia    Anginal pain (HCC)    per patient   Anxiety    per patient    Asthma    Bipolar disorder (HCC)    CHF (congestive heart failure) (HCC)    COPD (chronic obstructive pulmonary disease) (HCC)    Coronary artery disease    Depression    Dyspnea    per patient due to COPD, and with walking long distances per patient   Dysrhythmia    Fibromyalgia    GERD (gastroesophageal reflux disease)    Heart murmur    History of blood transfusion    History of hiatal hernia    Hypertension     Mitral regurgitation    Moderate MR 03/2019   On supplemental oxygen by nasal cannula    per patient 2L at night   Pneumonia    Rheumatoid arthritis (HCC)    per patient diagnosed within the past 2 years, stated on 12/24/2019   Sleep apnea    per patient diagnosed about 3-4 years ago and does not use a CPAP at night    Past Surgical History:  Procedure Laterality Date   ABDOMINAL HYSTERECTOMY     CARDIAC CATHETERIZATION     CORONARY ANGIOPLASTY     DES RCA x3 01/05/16   KNEE SURGERY Left    TONSILLECTOMY     TOTAL KNEE ARTHROPLASTY Left 12/31/2019   Procedure: LEFT TOTAL KNEE ARTHROPLASTY;  Surgeon: Eldred Manges, MD;  Location: [facility] OR;  Service: Orthopedics;  Laterality: Left;    Current Outpatient Medications  Medication Sig Dispense Refill Last Dose   albuterol (PROVENTIL HFA;VENTOLIN HFA) 108 (90 Base) MCG/ACT inhaler Inhale 2 puffs into the lungs every 6 (six) hours as needed for wheezing or shortness of breath.       amLODipine (NORVASC) 2.5 MG tablet Take 2.5 mg by mouth daily.      apixaban (ELIQUIS) 5 MG TABS tablet Take 5 mg by mouth 2 (two) times daily.      aspirin EC 325 MG EC tablet Take 1 tablet (325 mg total) by mouth daily with breakfast. 30 tablet 0  atorvastatin (LIPITOR) 80 MG tablet Take 80 mg by mouth daily.      benztropine (COGENTIN) 0.5 MG tablet Take 0.5 mg by mouth 2 (two) times daily.       BREZTRI AEROSPHERE 160-9-4.8 MCG/ACT AERO Inhale 2 puffs into the lungs 2 (two) times daily.      Buprenorphine HCl-Naloxone HCl 8-2 MG FILM Place 1 Film under the tongue in the morning and at bedtime.       cetirizine (ZYRTEC) 10 MG tablet Take 10 mg by mouth at bedtime.      cyclobenzaprine (FLEXERIL) 10 MG tablet Take 10 mg by mouth 3 (three) times daily as needed for muscle spasms.      ezetimibe (ZETIA) 10 MG tablet Take 10 mg by mouth daily.      famotidine (PEPCID) 20 MG tablet Take 20 mg by mouth 2 (two) times daily.      fenofibrate (TRICOR) 145 MG tablet Take 145  mg by mouth daily.       ferrous sulfate 325 (65 FE) MG tablet Take 325 mg by mouth daily.      fluticasone (FLONASE) 50 MCG/ACT nasal spray Place 2 sprays into both nostrils daily as needed for allergies.      folic acid (FOLVITE) 1 MG tablet Take 1 mg by mouth daily.      hydrochlorothiazide (HYDRODIURIL) 25 MG tablet Take 25 mg by mouth daily.       magnesium oxide (MAG-OX) 400 MG tablet Take 400 mg by mouth 2 (two) times daily.       metoprolol tartrate (LOPRESSOR) 25 MG tablet Take 25 mg by mouth 2 (two) times daily.       nitroGLYCERIN (NITROSTAT) 0.4 MG SL tablet Place 0.4 mg under the tongue every 5 (five) minutes as needed for chest pain.       nystatin (MYCOSTATIN/NYSTOP) powder Apply 1 Application topically 3 (three) times daily as needed (irritation).      ondansetron (ZOFRAN-ODT) 4 MG disintegrating tablet Take 4 mg by mouth every 8 (eight) hours as needed for nausea or vomiting.      OXYGEN Inhale 4 L into the lungs at bedtime.      pantoprazole (PROTONIX) 40 MG tablet Take 40 mg by mouth 2 (two) times daily.      potassium chloride SA (KLOR-CON) 20 MEQ tablet Take 20 mEq by mouth 2 (two) times daily.       Rimegepant Sulfate (NURTEC) 75 MG TBDP Take 75 mg by mouth daily as needed (migraines).      risperiDONE (RISPERDAL) 3 MG tablet Take 3 mg by mouth at bedtime.       sucralfate (CARAFATE) 1 g tablet Take 1 g by mouth 3 (three) times daily.      venlafaxine XR (EFFEXOR-XR) 150 MG 24 hr capsule Take 300 mg by mouth daily with breakfast.       Vitamin D, Ergocalciferol, (DRISDOL) 1.25 MG (50000 UNIT) CAPS capsule Take 50,000 Units by mouth every 30 (thirty) days.      No current facility-administered medications for this visit.   Allergies  Allergen Reactions   Nsaids Nausea Only and Other (See Comments)    Heartburn    Social History   Tobacco Use   Smoking status: Every Day    Current packs/day: 1.00    Types: Cigarettes   Smokeless tobacco: Never  Substance Use  Topics   Alcohol use: No    Family History  Problem Relation Age of Onset  Cancer Father    Breast cancer Neg Hx      Review of Systems  All other systems reviewed and are negative.   Objective:  Physical Exam Expand All Collapse All     Office Visit Note              Patient: KHRYSTYNA WILDRICK                                           Date of Birth: 10/05/1966                                                    MRN: [id number] Visit Date: 05/08/2023                                                                     Requested by: Erskine Emery, NP [address] [place],  [place] [address] PCP: Erskine Emery, NP     Assessment & Plan: Visit Diagnoses:  1. Chronic pain of right knee   2. Unilateral primary osteoarthritis, right knee       Plan: Patient like to proceed with total knee arthroplasty right knee.  She has failed conservative treatment.  She need cardiac clearance with her cardiac history.  We discussed adductor block spinal anesthesia, Exparel and Marcaine infiltration overnight stay in the hospital as she did for her other knee.  We placed her on telemetry if cardiology feels this is recommendation.  Questions were elicited and answered.   Follow-Up Instructions: No follow-ups on file.    Orders:     Orders Placed This Encounter  Procedures   XR KNEE 3 VIEW RIGHT    No orders of the defined types were placed in this encounter.        Procedures: No procedures performed     Clinical Data: No additional findings.     Subjective:    Chief Complaint  Patient presents with   Right Knee - Pain      HPI 57 year old female returns with progressive right knee osteoarthritis with failure to respond anti-inflammatories, Tylenol, topical rubs, use of a cane.  She had previous left total knee arthroplasty by me 3 years ago doing well full range of motion and no pain.  Patient states the right knee has progressed the injection has not helped and now she is  ready to schedule total knee arthroplasty.   Patient uses some oxygen at night.  She has been on Mobic for the arthritis.  Previous history of atrial flutter she was on Eliquis at 1 point.  Had mitral valve replacement atrial flutter with ablation treatment 02/14/2023.  Patient [facility] for cardiology.  No current chest pain.   Review of Systems positive COPD, atrial flutter with ablation previous median sternotomy with mitral valve replacement.  Nighttime oxygen.     Objective: Vital Signs: BP (!) 88/63   Pulse 81    Physical Exam Constitutional:  Appearance: She is well-developed.  HENT:     Head: Normocephalic.     Right Ear: External ear normal.     Left Ear: External ear normal. There is no impacted cerumen.  Eyes:     Pupils: Pupils are equal, round, and reactive to light.  Neck:     Thyroid: No thyromegaly.     Trachea: No tracheal deviation.  Cardiovascular:     Rate and Rhythm: Normal rate.  Pulmonary:     Effort: Pulmonary effort is normal.  Abdominal:     Palpations: Abdomen is soft.  Musculoskeletal:     Cervical back: No rigidity.  Skin:    General: Skin is warm and dry.  Neurological:     Mental Status: She is alert and oriented to person, place, and time.  Psychiatric:        Behavior: Behavior normal.    Ortho Exam negative logroll right and left hip well-healed midline left knee incision.  Right knee crepitus knee range of motion she lacks 5 degrees reaching full extension flexes to 95 degrees.  Collateral ligaments are balanced.  More medial than lateral joint line tenderness palpable lateral osteophytes  Vital signs in last 24 hours: @VSRANGES @  Labs:   Estimated body mass index is 34.27 kg/m as calculated from the following:   Height as of an earlier encounter on 07/06/23: 5' 0.5" (1.537 m).   Weight as of an earlier encounter on 07/06/23: 80.9 kg.   Imaging Review Plain radiographs demonstrate moderate degenerative joint  disease of the right knee(s). The overall alignment isneutral. The bone quality appears to be fair for age and reported activity level.      Assessment/Plan:  End stage arthritis, right knee   The patient history, physical examination, clinical judgment of the provider and imaging studies are consistent with end stage degenerative joint disease of the right knee(s) and total knee arthroplasty is deemed medically necessary. The treatment options including medical management, injection therapy arthroscopy and arthroplasty were discussed at length. The risks and benefits of total knee arthroplasty were presented and reviewed. The risks due to aseptic loosening, infection, stiffness, patella tracking problems, thromboembolic complications and other imponderables were discussed. The patient acknowledged the explanation, agreed to proceed with the plan and consent was signed. Patient is being admitted for inpatient treatment for surgery, pain control, PT, OT, prophylactic antibiotics, VTE prophylaxis, progressive ambulation and ADL's and discharge planning. The patient is planning to be discharged home with home health services     Patient's anticipated LOS is less than 2 midnights, meeting these requirements: - Younger than 59 - Lives within 1 hour of care - Has a competent adult at home to recover with post-op recover - NO history of  - Chronic pain requiring opiods  - Diabetes  - Coronary Artery Disease  - Heart failure  - Heart attack  - Stroke  - DVT/VTE  - Cardiac arrhythmia  - Respiratory Failure/COPD  - Renal failure  - Anemia  - Advanced Liver disease

## 2023-07-06 NOTE — Progress Notes (Addendum)
PCP - Drusilla Kanner, NP.  Cardiologist -  Maurie Boettcher, NP, with [facility], [facility] Mrs Cobos said she is supposed to hold Buprenorphine for 1 day. EP-no  Endocrine-no  Pulm-  Chest x-ray - NA  EKG - 06/12/23- CE- requested  Stress Test -   ECHO - 04/24/23  Cardiac Cath -   AICD-no PM-no LOOP-no  Nerve Stimulator-no  Dialysis-no  Sleep Study - yes CPAP - no- wears oxygen at 4 liters at hs.  LABS-CBC, BMP, PCR  ASA-no  ERAS-yes  HA1C-na GLP-1-no Fasting Blood Sugar - 0__ times a day  Anesthesia- Ms Jeffery reports that she had pain in her mid chest, more to the left on 07/05/23. Patient described the pain as stabbing, lasted all day, she did not take NTG- she said she is afraid of it, due to the chance of headache. Ms Sackett denies shortness of breath, n/v, diaphoresis.  Ms Fairfield reports that she just took it easy all day, the pain is gone today. I notified Antionette Poles, PA-C.  I am requesting EKG tracing , stress test, asked if  a cardiac clearance for surgery was requested.  Pt denies having ,sob, or fever at this time. All instructions explained to the pt, with a verbal understanding of the material. Pt agrees to go over the instructions while at home for a better understanding. The opportunity to ask questions was provided.

## 2023-07-09 ENCOUNTER — Ambulatory Visit: Payer: 59 | Admitting: Physician Assistant

## 2023-07-09 NOTE — Progress Notes (Signed)
Anesthesia Chart Review:  Follows with cardiology at [facility] for history of atypical atrial flutter s/p ablation 02/14/2023, atrial fibrillation on Eliquis, severe mitral stenosis s/p mitral valve placement and ligation of left atrial appendage 04/18/2022, CAD s/p stent x 3 to RCA in 2017 with CTO of distal OM, moderate pulmonary hypertension, HTN, HLD.  Recent ZIO monitor 05/14/2023 showed frequent PACs, 62 episodes of atrial tachycardia lasting 4-15 beats with average rate 122. Metoprolol was increased.    Echo 04/24/2023 showed EF 55 to 60%, mitral valve replacement prosthesis well-seated with normal function, moderate tricuspid regurgitation, moderate pulmonary hypertension, no change from prior study.  Lexiscan 07/2022 was negative for ischemia.    Last seen by cardiology APP Maurie Boettcher, NP 06/12/2023.  Per note, "Ms. Gong is stable from a cardiac standpoint and denies anginal sounding chest discomfort but continues to endorse chronic fatigue."  She was encouraged to use CPAP as prescribed and take all prescribed medications.  No changes to management.  60-month follow-up recommended.  She was previously cleared by Maurie Boettcher, NP on 05/08/2023.  Copy of clearance on patient chart.  Current smoker with associated COPD.  She is maintained on [employer], as needed albuterol, and uses 4 L supplemental O2 via [place] nightly.  She has a diagnosis of OSA but has been noncompliant with CPAP.  History of hiatal hernia s/p Nissen fundoplication.  Recent upper GI study 03/08/2023 showed moderate-sized paraesophageal hernia.  I spoke with patient at her preop testing visit due to report of chest discomfort the day prior.  She describes having some vague discomfort in her chest throughout the day on 07/05/2023.  She says she has had similar episodes in the past and has discussed them with cardiology.  She attributes it to gas/indigestion.  States she was not concerned that it was of cardiac etiology.  She  does have a history of hiatal hernia s/p laparoscopic Nissen fundoplication (recent upper GI study 03/08/2023 showed moderate-sized paraesophageal hiatal hernia).  She states the discomfort was not related to exertion, and denied any radiation of pain or other symptoms including palpitations, diaphoresis, presyncope, or any other cardiopulmonary complaints. Today she is at her baseline, denies any chest discomfort, continues to have chronic stable DOE, otherwise feeling well.  We discussed that her symptoms do not sound anginal in nature.  She describes prior similar episodes that resolved spontaneously and states they have been discussed with her cardiologist.  She had a negative nuclear stress test in October 2023.  She understands to reach out to her cardiologist if she develops any symptoms concerning for anginal pain including radiating pain, pain with exertion, presyncope/syncope, diaphoresis, worsening shortness of breath.  I called the patient on 07/11/2023 to follow-up and she confirmed she had not had any further episodes of chest discomfort or any other concerning symptoms.  Per Sherrie at Dr. Marlene Bast office, patient was instructed to hold Eliquis 72 hours prior to procedure.  Reviewed history with anesthesiologist Dr. Okey Dupre. Advised ok to proceed as planned barring acute status change.   Preop labs reviewed, unremarkable.  EKG 06/12/2023 (copy on chart): Sinus rhythm.  Rate 66.  Cannot rule out anterior infarct, age-indeterminate.  No significant change  TTE 04/24/2023 (Care Everywhere): SUMMARY  Normal LV size, wall thickness, wall motion and systolic function with ejection fraction 55-60%  Abnormal  paradoxical  septal motion consistent with post-operative status.  The right ventricle is mild to moderately dilated.  The right atrium is mildly to moderately dilated.  Mitral valve  replacement with 27 Edwards Mitris Resilia 04-05-22.  The prosthetic mitral valve is well-seated with normal  function.  There is moderate tricuspid regurgitation.  Estimated right ventricular systolic pressure is 44.5 mmHg.  Moderate pulmonary hypertension.  Probably no significant change in comparison with the prior study noted     Zannie Cove [facility] Short Stay [facility]/Anesthesiology Phone [phone] 07/11/2023 2:06 PM

## 2023-07-11 NOTE — Anesthesia Preprocedure Evaluation (Addendum)
Anesthesia Evaluation  Patient identified by MRN, date of birth, ID band Patient awake    Reviewed: Allergy & Precautions, NPO status , Patient's Chart, lab work & pertinent test results, reviewed documented beta blocker date and time   History of Anesthesia Complications Negative for: history of anesthetic complications  Airway Mallampati: II  TM Distance: >3 FB Neck ROM: Full    Dental  (+) Edentulous Upper, Edentulous Lower   Pulmonary asthma , sleep apnea , COPD,  COPD inhaler, Current Smoker and Patient abstained from smoking.  Nocturnal oxygen    Pulmonary exam normal        Cardiovascular hypertension, Pt. on medications and Pt. on home beta blockers pulmonary hypertension+ CAD and +CHF  Normal cardiovascular exam+ dysrhythmias + Valvular Problems/Murmurs (s/p MVR)    '24 TTE - EF 55-60%. Abnormal paradoxical septal motion consistent with post-operative status. The right ventricle is mild to moderately dilated. The right atrium is mildly to moderately dilated. Mitral valve replacement with 27 Edwards Mitris Resilia 04-05-22. The prosthetic mitral valve is well-seated with normal function. There is moderate tricuspid regurgitation. Estimated right ventricular systolic pressure is 44.5 mmHg. Moderate pulmonary hypertension.     Neuro/Psych  PSYCHIATRIC DISORDERS Anxiety Depression Bipolar Disorder   negative neurological ROS     GI/Hepatic Neg liver ROS, hiatal hernia,GERD  Medicated and Controlled,,  Endo/Other   Obesity   Renal/GU negative Renal ROS     Musculoskeletal  (+) Arthritis , Rheumatoid disorders,  Fibromyalgia -  Abdominal   Peds  Hematology  On eliquis    Anesthesia Other Findings On subutex   Reproductive/Obstetrics                             Anesthesia Physical Anesthesia Plan  ASA: 3  Anesthesia Plan: Spinal   Post-op Pain Management: Regional block* and  Tylenol PO (pre-op)*   Induction:   PONV Risk Score and Plan: 1 and Treatment may vary due to age or medical condition and Propofol infusion  Airway Management Planned: Natural Airway and Simple Face Mask  Additional Equipment: None  Intra-op Plan:   Post-operative Plan:   Informed Consent: I have reviewed the patients History and Physical, chart, labs and discussed the procedure including the risks, benefits and alternatives for the proposed anesthesia with the patient or authorized representative who has indicated his/her understanding and acceptance.       Plan Discussed with: CRNA and Anesthesiologist  Anesthesia Plan Comments: (See PAT note )        Anesthesia Quick Evaluation

## 2023-07-18 ENCOUNTER — Inpatient Hospital Stay (HOSPITAL_COMMUNITY)
Admission: RE | Admit: 2023-07-18 | Discharge: 2023-07-20 | DRG: 470 | Disposition: A | Discharge status: Home Health | Payer: 59 | Attending: Orthopaedic Surgery | Admitting: Orthopaedic Surgery

## 2023-07-18 ENCOUNTER — Encounter (HOSPITAL_COMMUNITY): Payer: Self-pay | Admitting: Orthopaedic Surgery

## 2023-07-18 ENCOUNTER — Observation Stay (HOSPITAL_COMMUNITY): Payer: 59 | Admitting: Physician Assistant

## 2023-07-18 ENCOUNTER — Encounter (HOSPITAL_COMMUNITY)
Admission: RE | Disposition: A | Discharge status: Home Health | Payer: Self-pay | Source: Home / Self Care | Attending: Orthopaedic Surgery

## 2023-07-18 ENCOUNTER — Observation Stay (HOSPITAL_COMMUNITY): Payer: 59 | Admitting: Anesthesiology

## 2023-07-18 ENCOUNTER — Other Ambulatory Visit: Payer: Self-pay

## 2023-07-18 DIAGNOSIS — M1711 Unilateral primary osteoarthritis, right knee: Secondary | ICD-10-CM | POA: Diagnosis not present

## 2023-07-18 DIAGNOSIS — Z952 Presence of prosthetic heart valve: Secondary | ICD-10-CM

## 2023-07-18 DIAGNOSIS — Z8701 Personal history of pneumonia (recurrent): Secondary | ICD-10-CM

## 2023-07-18 DIAGNOSIS — Z96659 Presence of unspecified artificial knee joint: Secondary | ICD-10-CM

## 2023-07-18 DIAGNOSIS — E119 Type 2 diabetes mellitus without complications: Secondary | ICD-10-CM | POA: Diagnosis present

## 2023-07-18 DIAGNOSIS — Z01818 Encounter for other preprocedural examination: Principal | ICD-10-CM

## 2023-07-18 DIAGNOSIS — G8929 Other chronic pain: Secondary | ICD-10-CM | POA: Diagnosis present

## 2023-07-18 DIAGNOSIS — M069 Rheumatoid arthritis, unspecified: Secondary | ICD-10-CM | POA: Diagnosis present

## 2023-07-18 DIAGNOSIS — F17210 Nicotine dependence, cigarettes, uncomplicated: Secondary | ICD-10-CM

## 2023-07-18 DIAGNOSIS — I2510 Atherosclerotic heart disease of native coronary artery without angina pectoris: Secondary | ICD-10-CM

## 2023-07-18 DIAGNOSIS — I110 Hypertensive heart disease with heart failure: Secondary | ICD-10-CM | POA: Diagnosis not present

## 2023-07-18 DIAGNOSIS — F319 Bipolar disorder, unspecified: Secondary | ICD-10-CM | POA: Diagnosis present

## 2023-07-18 DIAGNOSIS — I10 Essential (primary) hypertension: Secondary | ICD-10-CM | POA: Diagnosis present

## 2023-07-18 DIAGNOSIS — J449 Chronic obstructive pulmonary disease, unspecified: Secondary | ICD-10-CM | POA: Diagnosis present

## 2023-07-18 DIAGNOSIS — I4892 Unspecified atrial flutter: Secondary | ICD-10-CM | POA: Diagnosis present

## 2023-07-18 DIAGNOSIS — Z886 Allergy status to analgesic agent status: Secondary | ICD-10-CM

## 2023-07-18 DIAGNOSIS — Z90710 Acquired absence of both cervix and uterus: Secondary | ICD-10-CM

## 2023-07-18 DIAGNOSIS — Z7982 Long term (current) use of aspirin: Secondary | ICD-10-CM

## 2023-07-18 DIAGNOSIS — Z96651 Presence of right artificial knee joint: Secondary | ICD-10-CM

## 2023-07-18 DIAGNOSIS — Z79899 Other long term (current) drug therapy: Secondary | ICD-10-CM

## 2023-07-18 DIAGNOSIS — I509 Heart failure, unspecified: Secondary | ICD-10-CM | POA: Diagnosis not present

## 2023-07-18 DIAGNOSIS — Z79891 Long term (current) use of opiate analgesic: Secondary | ICD-10-CM

## 2023-07-18 DIAGNOSIS — Z96652 Presence of left artificial knee joint: Secondary | ICD-10-CM | POA: Diagnosis present

## 2023-07-18 DIAGNOSIS — Z7901 Long term (current) use of anticoagulants: Secondary | ICD-10-CM

## 2023-07-18 DIAGNOSIS — M179 Osteoarthritis of knee, unspecified: Secondary | ICD-10-CM | POA: Insufficient documentation

## 2023-07-18 DIAGNOSIS — Z9861 Coronary angioplasty status: Secondary | ICD-10-CM

## 2023-07-18 DIAGNOSIS — G4730 Sleep apnea, unspecified: Secondary | ICD-10-CM | POA: Diagnosis present

## 2023-07-18 DIAGNOSIS — Z9981 Dependence on supplemental oxygen: Secondary | ICD-10-CM

## 2023-07-18 DIAGNOSIS — Z8619 Personal history of other infectious and parasitic diseases: Secondary | ICD-10-CM

## 2023-07-18 DIAGNOSIS — M797 Fibromyalgia: Secondary | ICD-10-CM | POA: Diagnosis present

## 2023-07-18 HISTORY — PX: TOTAL KNEE ARTHROPLASTY: SHX125

## 2023-07-18 SURGERY — ARTHROPLASTY, KNEE, TOTAL
Anesthesia: Spinal | Site: Knee | Laterality: Right

## 2023-07-18 MED ORDER — ACETAMINOPHEN 500 MG PO TABS
1000.0000 mg | ORAL_TABLET | Freq: Four times a day (QID) | ORAL | Status: AC
  Administered 2023-07-18 – 2023-07-19 (×3): 1000 mg via ORAL
  Filled 2023-07-18 (×4): qty 2

## 2023-07-18 MED ORDER — SODIUM CHLORIDE 0.9 % IV SOLN: INTRAVENOUS | Status: DC

## 2023-07-18 MED ORDER — BUDESON-GLYCOPYRROL-FORMOTEROL 160-9-4.8 MCG/ACT IN AERO: 2.0000 | INHALATION_SPRAY | Freq: Two times a day (BID) | RESPIRATORY_TRACT | Status: DC

## 2023-07-18 MED ORDER — OXYCODONE HCL 5 MG PO TABS
5.0000 mg | ORAL_TABLET | ORAL | Status: DC | PRN
  Administered 2023-07-19: 10 mg via ORAL
  Filled 2023-07-18 (×2): qty 2

## 2023-07-18 MED ORDER — UMECLIDINIUM BROMIDE 62.5 MCG/ACT IN AEPB
1.0000 | INHALATION_SPRAY | Freq: Every day | RESPIRATORY_TRACT | Status: DC
  Administered 2023-07-19 – 2023-07-20 (×2): 1 via RESPIRATORY_TRACT
  Filled 2023-07-18: qty 7

## 2023-07-18 MED ORDER — PANTOPRAZOLE SODIUM 40 MG PO TBEC
40.0000 mg | DELAYED_RELEASE_TABLET | Freq: Two times a day (BID) | ORAL | Status: DC
  Administered 2023-07-18 – 2023-07-20 (×4): 40 mg via ORAL
  Filled 2023-07-18 (×5): qty 1

## 2023-07-18 MED ORDER — PHENYLEPHRINE 80 MCG/ML (10ML) SYRINGE FOR IV PUSH (FOR BLOOD PRESSURE SUPPORT)
PREFILLED_SYRINGE | INTRAVENOUS | Status: DC | PRN
  Administered 2023-07-18: 160 ug via INTRAVENOUS

## 2023-07-18 MED ORDER — ACETAMINOPHEN 500 MG PO TABS
ORAL_TABLET | ORAL | Status: AC
  Administered 2023-07-18: 1000 mg via ORAL
  Filled 2023-07-18: qty 2

## 2023-07-18 MED ORDER — BENZTROPINE MESYLATE 0.5 MG PO TABS
0.5000 mg | ORAL_TABLET | Freq: Two times a day (BID) | ORAL | Status: DC
  Administered 2023-07-18 – 2023-07-20 (×4): 0.5 mg via ORAL
  Filled 2023-07-18 (×5): qty 1

## 2023-07-18 MED ORDER — APIXABAN 5 MG PO TABS
5.0000 mg | ORAL_TABLET | Freq: Two times a day (BID) | ORAL | Status: DC
  Administered 2023-07-19 – 2023-07-20 (×2): 5 mg via ORAL
  Filled 2023-07-18 (×2): qty 1

## 2023-07-18 MED ORDER — PHENYLEPHRINE 80 MCG/ML (10ML) SYRINGE FOR IV PUSH (FOR BLOOD PRESSURE SUPPORT)
PREFILLED_SYRINGE | INTRAVENOUS | Status: AC
  Filled 2023-07-18: qty 10

## 2023-07-18 MED ORDER — PHENOL 1.4 % MT LIQD: 1.0000 | OROMUCOSAL | Status: DC | PRN

## 2023-07-18 MED ORDER — OXYCODONE HCL 5 MG/5ML PO SOLN
5.0000 mg | Freq: Once | ORAL | Status: AC | PRN
  Administered 2023-07-18: 5 mg via ORAL

## 2023-07-18 MED ORDER — NYSTATIN 100000 UNIT/GM EX POWD: 1.0000 | Freq: Three times a day (TID) | CUTANEOUS | Status: DC | PRN

## 2023-07-18 MED ORDER — HYDROCHLOROTHIAZIDE 25 MG PO TABS
25.0000 mg | ORAL_TABLET | Freq: Every day | ORAL | Status: DC
  Administered 2023-07-18 – 2023-07-20 (×3): 25 mg via ORAL
  Filled 2023-07-18 (×3): qty 1

## 2023-07-18 MED ORDER — LIDOCAINE 2% (20 MG/ML) 5 ML SYRINGE
INTRAMUSCULAR | Status: AC
  Filled 2023-07-18: qty 5

## 2023-07-18 MED ORDER — PROPOFOL 1000 MG/100ML IV EMUL
INTRAVENOUS | Status: AC
  Filled 2023-07-18: qty 200

## 2023-07-18 MED ORDER — GLYCOPYRROLATE PF 0.2 MG/ML IJ SOSY
PREFILLED_SYRINGE | INTRAMUSCULAR | Status: DC | PRN
Start: 2023-07-18 — End: 2023-07-18
  Administered 2023-07-18: .2 mg via INTRAVENOUS

## 2023-07-18 MED ORDER — BUPIVACAINE LIPOSOME 1.3 % IJ SUSP
INTRAMUSCULAR | Status: AC
  Filled 2023-07-18: qty 20

## 2023-07-18 MED ORDER — FENTANYL CITRATE (PF) 100 MCG/2ML IJ SOLN
INTRAMUSCULAR | Status: AC
  Administered 2023-07-18: 50 ug
  Filled 2023-07-18: qty 2

## 2023-07-18 MED ORDER — METOPROLOL TARTRATE 25 MG PO TABS
25.0000 mg | ORAL_TABLET | Freq: Two times a day (BID) | ORAL | Status: DC
  Administered 2023-07-18 – 2023-07-20 (×3): 25 mg via ORAL
  Filled 2023-07-18 (×4): qty 1

## 2023-07-18 MED ORDER — BUPIVACAINE LIPOSOME 1.3 % IJ SUSP
INTRAMUSCULAR | Status: DC | PRN
  Administered 2023-07-18: 20 mL

## 2023-07-18 MED ORDER — FERROUS SULFATE 325 (65 FE) MG PO TABS: 325.0000 mg | ORAL_TABLET | Freq: Every day | ORAL | Status: DC

## 2023-07-18 MED ORDER — FOLIC ACID 1 MG PO TABS
1.0000 mg | ORAL_TABLET | Freq: Every day | ORAL | Status: DC
  Administered 2023-07-18 – 2023-07-20 (×3): 1 mg via ORAL
  Filled 2023-07-18 (×3): qty 1

## 2023-07-18 MED ORDER — METOCLOPRAMIDE HCL 5 MG/ML IJ SOLN: 5.0000 mg | Freq: Three times a day (TID) | INTRAMUSCULAR | Status: DC | PRN

## 2023-07-18 MED ORDER — KETAMINE HCL 50 MG/5ML IJ SOSY
PREFILLED_SYRINGE | INTRAMUSCULAR | Status: DC | PRN
Start: 2023-07-18 — End: 2023-07-18
  Administered 2023-07-18: 10 mg via INTRAVENOUS

## 2023-07-18 MED ORDER — DOCUSATE SODIUM 100 MG PO CAPS
100.0000 mg | ORAL_CAPSULE | Freq: Two times a day (BID) | ORAL | Status: DC
  Administered 2023-07-18 – 2023-07-20 (×4): 100 mg via ORAL
  Filled 2023-07-18 (×4): qty 1

## 2023-07-18 MED ORDER — TRANEXAMIC ACID-NACL 1000-0.7 MG/100ML-% IV SOLN
1000.0000 mg | INTRAVENOUS | Status: AC
  Administered 2023-07-18: 1000 mg via INTRAVENOUS
  Filled 2023-07-18: qty 100

## 2023-07-18 MED ORDER — CEFAZOLIN SODIUM-DEXTROSE 2-4 GM/100ML-% IV SOLN
2.0000 g | INTRAVENOUS | Status: AC
  Administered 2023-07-18: 2 g via INTRAVENOUS
  Filled 2023-07-18: qty 100

## 2023-07-18 MED ORDER — ATORVASTATIN CALCIUM 80 MG PO TABS
80.0000 mg | ORAL_TABLET | Freq: Every day | ORAL | Status: DC
  Administered 2023-07-19 – 2023-07-20 (×2): 80 mg via ORAL
  Filled 2023-07-18 (×2): qty 1

## 2023-07-18 MED ORDER — PROPOFOL 500 MG/50ML IV EMUL
INTRAVENOUS | Status: DC | PRN
  Administered 2023-07-18: 50 ug/kg/min via INTRAVENOUS

## 2023-07-18 MED ORDER — BUPIVACAINE HCL (PF) 0.25 % IJ SOLN
INTRAMUSCULAR | Status: DC | PRN
  Administered 2023-07-18: 20 mL

## 2023-07-18 MED ORDER — MIDAZOLAM HCL 2 MG/2ML IJ SOLN
INTRAMUSCULAR | Status: AC
  Administered 2023-07-18: 1 mg via INTRAVENOUS
  Filled 2023-07-18: qty 2

## 2023-07-18 MED ORDER — LACTATED RINGERS IV SOLN: INTRAVENOUS | Status: DC

## 2023-07-18 MED ORDER — PHENYLEPHRINE HCL-NACL 20-0.9 MG/250ML-% IV SOLN
INTRAVENOUS | Status: DC | PRN
  Administered 2023-07-18: 80 ug via INTRAVENOUS
  Administered 2023-07-18 (×7): 160 ug via INTRAVENOUS

## 2023-07-18 MED ORDER — SODIUM CHLORIDE 0.9 % IR SOLN
Status: DC | PRN
  Administered 2023-07-18: 3000 mL

## 2023-07-18 MED ORDER — MIDAZOLAM HCL 2 MG/2ML IJ SOLN
INTRAMUSCULAR | Status: DC | PRN
  Administered 2023-07-18 (×2): 1 mg via INTRAVENOUS

## 2023-07-18 MED ORDER — ALBUTEROL SULFATE (2.5 MG/3ML) 0.083% IN NEBU: 2.5000 mg | INHALATION_SOLUTION | Freq: Four times a day (QID) | RESPIRATORY_TRACT | Status: DC | PRN

## 2023-07-18 MED ORDER — VITAMIN D (ERGOCALCIFEROL) 1.25 MG (50000 UNIT) PO CAPS: 50000.0000 [IU] | ORAL_CAPSULE | ORAL | Status: DC

## 2023-07-18 MED ORDER — ONDANSETRON HCL 4 MG/2ML IJ SOLN: 4.0000 mg | Freq: Four times a day (QID) | INTRAMUSCULAR | Status: DC | PRN

## 2023-07-18 MED ORDER — BUPIVACAINE IN DEXTROSE 0.75-8.25 % IT SOLN
INTRATHECAL | Status: DC | PRN
Start: 2023-07-18 — End: 2023-07-18
  Administered 2023-07-18: 1.6 mL via INTRATHECAL

## 2023-07-18 MED ORDER — CYCLOBENZAPRINE HCL 10 MG PO TABS
10.0000 mg | ORAL_TABLET | Freq: Three times a day (TID) | ORAL | Status: DC | PRN
  Administered 2023-07-18 – 2023-07-19 (×2): 10 mg via ORAL
  Filled 2023-07-18 (×2): qty 1

## 2023-07-18 MED ORDER — LORATADINE 10 MG PO TABS
10.0000 mg | ORAL_TABLET | Freq: Every day | ORAL | Status: DC
  Administered 2023-07-18 – 2023-07-20 (×3): 10 mg via ORAL
  Filled 2023-07-18 (×3): qty 1

## 2023-07-18 MED ORDER — SUCRALFATE 1 G PO TABS
1.0000 g | ORAL_TABLET | Freq: Three times a day (TID) | ORAL | Status: DC
  Administered 2023-07-18 – 2023-07-20 (×6): 1 g via ORAL
  Filled 2023-07-18 (×6): qty 1

## 2023-07-18 MED ORDER — MAGNESIUM OXIDE 400 MG PO TABS
400.0000 mg | ORAL_TABLET | Freq: Two times a day (BID) | ORAL | Status: DC
  Filled 2023-07-18: qty 1

## 2023-07-18 MED ORDER — RISPERIDONE 3 MG PO TABS
3.0000 mg | ORAL_TABLET | Freq: Every day | ORAL | Status: DC
  Administered 2023-07-18 – 2023-07-19 (×2): 3 mg via ORAL
  Filled 2023-07-18 (×3): qty 1

## 2023-07-18 MED ORDER — FENOFIBRATE 54 MG PO TABS
54.0000 mg | ORAL_TABLET | Freq: Every day | ORAL | Status: DC
  Administered 2023-07-19 – 2023-07-20 (×2): 54 mg via ORAL
  Filled 2023-07-18 (×2): qty 1

## 2023-07-18 MED ORDER — RIMEGEPANT SULFATE 75 MG PO TBDP: 75.0000 mg | ORAL_TABLET | Freq: Every day | ORAL | Status: DC | PRN

## 2023-07-18 MED ORDER — AMLODIPINE BESYLATE 2.5 MG PO TABS
2.5000 mg | ORAL_TABLET | Freq: Every day | ORAL | Status: DC
  Administered 2023-07-19 – 2023-07-20 (×2): 2.5 mg via ORAL
  Filled 2023-07-18 (×2): qty 1

## 2023-07-18 MED ORDER — FERROUS SULFATE 325 (65 FE) MG PO TABS
325.0000 mg | ORAL_TABLET | Freq: Three times a day (TID) | ORAL | Status: DC
  Administered 2023-07-19 – 2023-07-20 (×5): 325 mg via ORAL
  Filled 2023-07-18 (×5): qty 1

## 2023-07-18 MED ORDER — FLUTICASONE FUROATE-VILANTEROL 100-25 MCG/ACT IN AEPB
1.0000 | INHALATION_SPRAY | Freq: Every day | RESPIRATORY_TRACT | Status: DC
  Administered 2023-07-20: 1 via RESPIRATORY_TRACT
  Filled 2023-07-18: qty 28

## 2023-07-18 MED ORDER — FLUTICASONE PROPIONATE 50 MCG/ACT NA SUSP: 2.0000 | Freq: Every day | NASAL | Status: DC | PRN

## 2023-07-18 MED ORDER — ACETAMINOPHEN 500 MG PO TABS
1000.0000 mg | ORAL_TABLET | Freq: Once | ORAL | Status: AC
  Filled 2023-07-18: qty 2

## 2023-07-18 MED ORDER — MIDAZOLAM HCL 2 MG/2ML IJ SOLN: 1.0000 mg | Freq: Once | INTRAMUSCULAR | Status: AC

## 2023-07-18 MED ORDER — GLYCOPYRROLATE PF 0.2 MG/ML IJ SOSY
PREFILLED_SYRINGE | INTRAMUSCULAR | Status: AC
  Filled 2023-07-18: qty 1

## 2023-07-18 MED ORDER — PHENYLEPHRINE HCL-NACL 20-0.9 MG/250ML-% IV SOLN
INTRAVENOUS | Status: AC
  Filled 2023-07-18: qty 250

## 2023-07-18 MED ORDER — FENTANYL CITRATE (PF) 100 MCG/2ML IJ SOLN: 50.0000 ug | Freq: Once | INTRAMUSCULAR | Status: AC

## 2023-07-18 MED ORDER — 0.9 % SODIUM CHLORIDE (POUR BTL) OPTIME
TOPICAL | Status: DC | PRN
Start: 2023-07-18 — End: 2023-07-18
  Administered 2023-07-18: 1000 mL

## 2023-07-18 MED ORDER — BUPIVACAINE HCL (PF) 0.25 % IJ SOLN
INTRAMUSCULAR | Status: AC
  Filled 2023-07-18: qty 30

## 2023-07-18 MED ORDER — FENTANYL CITRATE (PF) 100 MCG/2ML IJ SOLN: 25.0000 ug | INTRAMUSCULAR | Status: DC | PRN

## 2023-07-18 MED ORDER — VENLAFAXINE HCL ER 150 MG PO CP24
300.0000 mg | ORAL_CAPSULE | Freq: Every day | ORAL | Status: DC
  Administered 2023-07-19 – 2023-07-20 (×2): 300 mg via ORAL
  Filled 2023-07-18 (×2): qty 2

## 2023-07-18 MED ORDER — OXYCODONE HCL 5 MG PO TABS: 5.0000 mg | ORAL_TABLET | Freq: Once | ORAL | Status: AC | PRN

## 2023-07-18 MED ORDER — PROPOFOL 10 MG/ML IV BOLUS
INTRAVENOUS | Status: AC
  Filled 2023-07-18: qty 20

## 2023-07-18 MED ORDER — ONDANSETRON HCL 4 MG PO TABS: 4.0000 mg | ORAL_TABLET | Freq: Four times a day (QID) | ORAL | Status: DC | PRN

## 2023-07-18 MED ORDER — FENTANYL CITRATE (PF) 250 MCG/5ML IJ SOLN
INTRAMUSCULAR | Status: DC | PRN
  Administered 2023-07-18: 25 ug via INTRAVENOUS

## 2023-07-18 MED ORDER — ACETAMINOPHEN 325 MG PO TABS: 325.0000 mg | ORAL_TABLET | Freq: Four times a day (QID) | ORAL | Status: DC | PRN

## 2023-07-18 MED ORDER — CHLORHEXIDINE GLUCONATE 0.12 % MT SOLN
15.0000 mL | OROMUCOSAL | Status: AC
  Administered 2023-07-18: 15 mL via OROMUCOSAL
  Filled 2023-07-18 (×2): qty 15

## 2023-07-18 MED ORDER — EZETIMIBE 10 MG PO TABS
10.0000 mg | ORAL_TABLET | Freq: Every day | ORAL | Status: DC
  Administered 2023-07-19 – 2023-07-20 (×2): 10 mg via ORAL
  Filled 2023-07-18 (×2): qty 1

## 2023-07-18 MED ORDER — METOCLOPRAMIDE HCL 5 MG PO TABS: 5.0000 mg | ORAL_TABLET | Freq: Three times a day (TID) | ORAL | Status: DC | PRN

## 2023-07-18 MED ORDER — ONDANSETRON 4 MG PO TBDP: 4.0000 mg | ORAL_TABLET | Freq: Three times a day (TID) | ORAL | Status: DC | PRN

## 2023-07-18 MED ORDER — MIDAZOLAM HCL 2 MG/2ML IJ SOLN
INTRAMUSCULAR | Status: AC
  Filled 2023-07-18: qty 2

## 2023-07-18 MED ORDER — NITROGLYCERIN 0.4 MG SL SUBL: 0.4000 mg | SUBLINGUAL_TABLET | SUBLINGUAL | Status: DC | PRN

## 2023-07-18 MED ORDER — KETAMINE HCL 50 MG/5ML IJ SOSY
PREFILLED_SYRINGE | INTRAMUSCULAR | Status: AC
  Filled 2023-07-18: qty 5

## 2023-07-18 MED ORDER — DIPHENHYDRAMINE HCL 12.5 MG/5ML PO ELIX: 12.5000 mg | ORAL_SOLUTION | ORAL | Status: DC | PRN

## 2023-07-18 MED ORDER — HYDROMORPHONE HCL 1 MG/ML IJ SOLN
0.5000 mg | INTRAMUSCULAR | Status: DC | PRN
  Administered 2023-07-18 – 2023-07-19 (×3): 1 mg via INTRAVENOUS
  Filled 2023-07-18 (×3): qty 1

## 2023-07-18 MED ORDER — MENTHOL 3 MG MT LOZG: 1.0000 | LOZENGE | OROMUCOSAL | Status: DC | PRN

## 2023-07-18 MED ORDER — POTASSIUM CHLORIDE CRYS ER 20 MEQ PO TBCR
20.0000 meq | EXTENDED_RELEASE_TABLET | Freq: Two times a day (BID) | ORAL | Status: DC
  Administered 2023-07-18 – 2023-07-20 (×4): 20 meq via ORAL
  Filled 2023-07-18 (×4): qty 1

## 2023-07-18 MED ORDER — ROPIVACAINE HCL 7.5 MG/ML IJ SOLN
INTRAMUSCULAR | Status: DC | PRN
Start: 2023-07-18 — End: 2023-07-18
  Administered 2023-07-18: 20 mL via PERINEURAL

## 2023-07-18 MED ORDER — FAMOTIDINE 20 MG PO TABS
20.0000 mg | ORAL_TABLET | Freq: Two times a day (BID) | ORAL | Status: DC
  Administered 2023-07-18 – 2023-07-20 (×4): 20 mg via ORAL
  Filled 2023-07-18 (×5): qty 1

## 2023-07-18 MED ORDER — MAGNESIUM OXIDE -MG SUPPLEMENT 400 (240 MG) MG PO TABS
400.0000 mg | ORAL_TABLET | Freq: Two times a day (BID) | ORAL | Status: DC
  Administered 2023-07-18 – 2023-07-20 (×4): 400 mg via ORAL
  Filled 2023-07-18 (×4): qty 1

## 2023-07-18 MED ORDER — PROPOFOL 500 MG/50ML IV EMUL
INTRAVENOUS | Status: AC
  Filled 2023-07-18: qty 50

## 2023-07-18 MED ORDER — OXYCODONE HCL 5 MG PO TABS
10.0000 mg | ORAL_TABLET | ORAL | Status: DC | PRN
  Administered 2023-07-18 – 2023-07-20 (×4): 15 mg via ORAL
  Administered 2023-07-20: 10 mg via ORAL
  Filled 2023-07-18 (×4): qty 3

## 2023-07-18 MED ORDER — PROMETHAZINE HCL 25 MG/ML IJ SOLN: 6.2500 mg | INTRAMUSCULAR | Status: DC | PRN

## 2023-07-18 MED ORDER — FENTANYL CITRATE (PF) 250 MCG/5ML IJ SOLN
INTRAMUSCULAR | Status: AC
  Filled 2023-07-18: qty 5

## 2023-07-18 MED ORDER — OXYCODONE HCL 5 MG/5ML PO SOLN
ORAL | Status: AC
  Filled 2023-07-18: qty 5

## 2023-07-18 MED ORDER — BUPRENORPHINE HCL-NALOXONE HCL 8-2 MG SL SUBL: 1.0000 | SUBLINGUAL_TABLET | Freq: Every day | SUBLINGUAL | Status: DC

## 2023-07-18 SURGICAL SUPPLY — 76 items
ADH SKN CLS APL DERMABOND .7 (GAUZE/BANDAGES/DRESSINGS) ×1
ATTUNE PS FEM RT SZ 5 CEM KNEE (Femur) IMPLANT
ATTUNE PSRP INSR SZ5 5 KNEE (Insert) IMPLANT
BAG COUNTER SPONGE SURGICOUNT (BAG) ×1 IMPLANT
BAG SPNG CNTER NS LX DISP (BAG) ×1
BANDAGE ESMARK 6X9 LF (GAUZE/BANDAGES/DRESSINGS) ×1 IMPLANT
BASE TIBIAL ROT PLAT SZ 5 KNEE (Knees) IMPLANT
BLADE SAGITTAL 25.0X1.19X90 (BLADE) ×1 IMPLANT
BLADE SAW SGTL 13X75X1.27 (BLADE) ×1 IMPLANT
BNDG CMPR 9X6 STRL LF SNTH (GAUZE/BANDAGES/DRESSINGS)
BNDG CMPR MED 10X6 ELC LF (GAUZE/BANDAGES/DRESSINGS) ×1
BNDG CMPR STD VLCR NS LF 5.8X4 (GAUZE/BANDAGES/DRESSINGS)
BNDG ELASTIC 4X5.8 VLCR NS LF (GAUZE/BANDAGES/DRESSINGS) ×1 IMPLANT
BNDG ELASTIC 4X5.8 VLCR STR LF (GAUZE/BANDAGES/DRESSINGS) ×1 IMPLANT
BNDG ELASTIC 6X10 VLCR STRL LF (GAUZE/BANDAGES/DRESSINGS) ×1 IMPLANT
BNDG ESMARK 6X9 LF (GAUZE/BANDAGES/DRESSINGS)
BOWL SMART MIX CTS (DISPOSABLE) ×1 IMPLANT
BSPLAT TIB 5 CMNT ROT PLAT STR (Knees) ×1 IMPLANT
CEMENT HV SMART SET (Cement) ×2 IMPLANT
COVER SURGICAL LIGHT HANDLE (MISCELLANEOUS) ×1 IMPLANT
CUFF TOURN SGL QUICK 34 (TOURNIQUET CUFF) ×1
CUFF TOURN SGL QUICK 42 (TOURNIQUET CUFF) IMPLANT
CUFF TRNQT CYL 34X4.125X (TOURNIQUET CUFF) ×1 IMPLANT
DERMABOND ADVANCED .7 DNX12 (GAUZE/BANDAGES/DRESSINGS) IMPLANT
DRAPE ORTHO SPLIT 77X108 STRL (DRAPES) ×2
DRAPE SURG ORHT 6 SPLT 77X108 (DRAPES) ×2 IMPLANT
DRAPE U-SHAPE 47X51 STRL (DRAPES) ×1 IMPLANT
DRSG AQUACEL AG ADV 3.5X10 (GAUZE/BANDAGES/DRESSINGS) IMPLANT
DURAPREP 26ML APPLICATOR (WOUND CARE) ×2 IMPLANT
ELECT REM PT RETURN 9FT ADLT (ELECTROSURGICAL) ×1
ELECTRODE REM PT RTRN 9FT ADLT (ELECTROSURGICAL) ×1 IMPLANT
FACESHIELD WRAPAROUND (MASK) ×2 IMPLANT
FACESHIELD WRAPAROUND OR TEAM (MASK) ×2 IMPLANT
GAUZE PAD ABD 8X10 STRL (GAUZE/BANDAGES/DRESSINGS) ×1 IMPLANT
GAUZE XEROFORM 5X9 LF (GAUZE/BANDAGES/DRESSINGS) ×1 IMPLANT
GLOVE BIOGEL PI IND STRL 8 (GLOVE) ×2 IMPLANT
GLOVE ORTHO TXT STRL SZ7.5 (GLOVE) ×2 IMPLANT
GOWN STRL REUS W/ TWL LRG LVL3 (GOWN DISPOSABLE) ×1 IMPLANT
GOWN STRL REUS W/ TWL XL LVL3 (GOWN DISPOSABLE) ×1 IMPLANT
GOWN STRL REUS W/TWL 2XL LVL3 (GOWN DISPOSABLE) ×1 IMPLANT
GOWN STRL REUS W/TWL LRG LVL3 (GOWN DISPOSABLE) ×1
GOWN STRL REUS W/TWL XL LVL3 (GOWN DISPOSABLE) ×1
HANDPIECE INTERPULSE COAX TIP (DISPOSABLE) ×1
IMMOBILIZER KNEE 22 UNIV (SOFTGOODS) ×1 IMPLANT
KIT BASIN OR (CUSTOM PROCEDURE TRAY) ×1 IMPLANT
KIT TURNOVER KIT B (KITS) ×1 IMPLANT
MANIFOLD NEPTUNE II (INSTRUMENTS) ×1 IMPLANT
MARKER SKIN DUAL TIP RULER LAB (MISCELLANEOUS) ×1 IMPLANT
NDL 18GX1X1/2 (RX/OR ONLY) (NEEDLE) ×1 IMPLANT
NDL HYPO 25GX1X1/2 BEV (NEEDLE) ×1 IMPLANT
NEEDLE 18GX1X1/2 (RX/OR ONLY) (NEEDLE) ×1 IMPLANT
NEEDLE HYPO 25GX1X1/2 BEV (NEEDLE) ×1 IMPLANT
NS IRRIG 1000ML POUR BTL (IV SOLUTION) ×1 IMPLANT
PACK TOTAL JOINT (CUSTOM PROCEDURE TRAY) ×1 IMPLANT
PAD ARMBOARD 7.5X6 YLW CONV (MISCELLANEOUS) ×2 IMPLANT
PADDING CAST COTTON 6X4 STRL (CAST SUPPLIES) ×1 IMPLANT
PATELLA MEDIAL ATTUN 35MM KNEE (Knees) IMPLANT
PIN STEINMAN FIXATION KNEE (PIN) IMPLANT
SET HNDPC FAN SPRY TIP SCT (DISPOSABLE) ×1 IMPLANT
STAPLER VISISTAT 35W (STAPLE) IMPLANT
SUCTION TUBE FRAZIER 10FR DISP (SUCTIONS) ×1 IMPLANT
SUT MNCRL AB 3-0 PS2 27 (SUTURE) IMPLANT
SUT MNCRL+ AB 3-0 CT1 36 (SUTURE) IMPLANT
SUT MON AB 2-0 CT1 36 (SUTURE) IMPLANT
SUT VIC AB 0 CT1 27 (SUTURE) ×1
SUT VIC AB 0 CT1 27XBRD ANBCTR (SUTURE) ×1 IMPLANT
SUT VIC AB 1 CTX 36 (SUTURE) ×2
SUT VIC AB 1 CTX36XBRD ANBCTR (SUTURE) ×2 IMPLANT
SUT VIC AB 2-0 CT1 27 (SUTURE) ×2
SUT VIC AB 2-0 CT1 TAPERPNT 27 (SUTURE) ×2 IMPLANT
SYR 50ML LL SCALE MARK (SYRINGE) ×1 IMPLANT
SYR CONTROL 10ML LL (SYRINGE) ×1 IMPLANT
TIBIAL BASE ROT PLAT SZ 5 KNEE (Knees) ×1 IMPLANT
TOWEL GREEN STERILE (TOWEL DISPOSABLE) ×1 IMPLANT
TOWEL GREEN STERILE FF (TOWEL DISPOSABLE) ×1 IMPLANT
TRAY CATH INTERMITTENT SS 16FR (CATHETERS) IMPLANT

## 2023-07-18 NOTE — Anesthesia Procedure Notes (Addendum)
Spinal  Staffing Performed by: Beryle Lathe, MD Authorized by: Beryle Lathe, MD

## 2023-07-18 NOTE — Interval H&P Note (Signed)
History and Physical Interval Note:  07/18/2023 3:24 PM  Lydia [name]  has presented today for surgery, with the diagnosis of right knee osteoarthritis.  The various methods of treatment have been discussed with the patient and family. After consideration of risks, benefits and other options for treatment, the patient has consented to  Procedure(s) with comments: RIGHT TOTAL KNEE ARTHROPLASTY (Right) - Needs RNFA as a surgical intervention.  The patient's history has been reviewed, patient examined, no change in status, stable for surgery.  I have reviewed the patient's chart and labs.  Questions were answered to the patient's satisfaction.     Eldred Manges

## 2023-07-18 NOTE — Transfer of Care (Signed)
Immediate Anesthesia Transfer of Care Note  Patient: Lydia [name]  Procedure(s) Performed: RIGHT TOTAL KNEE ARTHROPLASTY (Right: Knee)  Patient Location: PACU  Anesthesia Type:Spinal  Level of Consciousness: awake, alert , and patient cooperative  Airway & Oxygen Therapy: Patient Spontanous Breathing and Patient connected to face mask oxygen  Post-op Assessment: Report given to RN and Post -op Vital signs reviewed and stable  Post vital signs: Reviewed and stable  Last Vitals:  Vitals Value Taken Time  BP 117/75 07/18/23 1737  Temp 36.7 C 07/18/23 1737  Pulse 64 07/18/23 1740  Resp 18 07/18/23 1740  SpO2 98 % 07/18/23 1740  Vitals shown include unfiled device data.  Last Pain:  Vitals:   07/18/23 1431  PainSc: 0-No pain      Patients Stated Pain Goal: 6 (07/18/23 1350)  Complications: No notable events documented.

## 2023-07-18 NOTE — Anesthesia Procedure Notes (Signed)
Spinal  Patient location during procedure: OR Start time: 07/18/2023 3:42 PM End time: 07/18/2023 3:45 PM Reason for block: surgical anesthesia Staffing Performed: anesthesiologist  Anesthesiologist: Beryle Lathe, MD Performed by: Beryle Lathe, MD Authorized by: Beryle Lathe, MD   Preanesthetic Checklist Completed: patient identified, IV checked, risks and benefits discussed, surgical consent, monitors and equipment checked, pre-op evaluation and timeout performed Spinal Block Patient position: sitting Prep: DuraPrep Patient monitoring: heart rate, cardiac monitor, continuous pulse ox and blood pressure Approach: midline Location: L3-4 Injection technique: single-shot Needle Needle type: Pencan  Needle gauge: 24 G Additional Notes Consent was obtained prior to the procedure with all questions answered and concerns addressed. Risks including, but not limited to, bleeding, infection, nerve damage, paralysis, failed block, inadequate analgesia, allergic reaction, high spinal, itching, and headache were discussed and the patient wished to proceed. Functioning IV was confirmed and monitors were applied. Sterile prep and drape, including hand hygiene, mask, and sterile gloves were used. The patient was positioned and the spine was prepped. The skin was anesthetized with lidocaine. Free flow of clear CSF was obtained prior to injecting local anesthetic into the CSF. The spinal needle aspirated freely following injection. The needle was carefully withdrawn. The patient tolerated the procedure well.   Leslye Peer, MD

## 2023-07-18 NOTE — Op Note (Signed)
Pre and postop diagnosis: Right knee primary osteoarthritis  Procedure: Right total knee arthroplasty  Surgeon: Annell Greening, MD  Assistant: Isabella Bowens, RNFA  Anesthesia preoperative adductor block plus spinal anesthesia plus Exparel and Marcaine 20+20.  Tourniquet time 300 x 42 minutes  Implants:mplants  CEMENT HV SMART SET - [id number]  Inventory Item: CEMENT HV SMART SET Serial no.: Model/Cat no.: [id number]  Implant name: CEMENT HV SMART SET - [id number] Laterality: Right Area: Knee  Manufacturer: [employer] Date of Manufacture:   Action: Implanted Number Used: 2   Device Identifier: Device Identifier Type:   BSPLAT TIB 5 CMNT ROT PLAT STR - [id number]  Inventory Item: BSPLAT TIB 5 CMNT ROT PLAT STR Serial no.: Model/Cat no.: [id number]  Implant name: BSPLAT TIB 5 CMNT ROT PLAT STR - [id number] Laterality: Right Area: Knee  Manufacturer: [employer] Date of Manufacture:   Action: Implanted Number Used: 1   Device Identifier: Device Identifier Type:   ATTUNE PS FEM RT SZ 5 CEM KNEE - [id number]  Inventory Item: ATTUNE PS FEM RT SZ 5 CEM KNEE Serial no.: Model/Cat no.: [id number]  Implant name: ATTUNE PS FEM RT SZ 5 CEM KNEE - [id number] Laterality: Right Area: Knee  Manufacturer: [employer] Date of Manufacture:   Action: Implanted Number Used: 1   Device Identifier: Device Identifier Type:   ATTUNE PSRP INSR SZ5 5 KNEE - [id number]  Inventory Item: ATTUNE PSRP INSR SZ5 5 KNEE Serial no.: Model/Cat no.: [id number]  Implant name: ATTUNE PSRP INSR SZ5 5 KNEE - [id number] Laterality: Right Area: Knee  Manufacturer: [employer] Date of Manufacture:   Action: Implanted Number Used: 1   Device Identifier: Device Identifier Type:   PATELLA MEDIAL ATTUN KNEE - [id number]  Inventory Item: PATELLA MEDIAL ATTUN KNEE Serial no.: Model/Cat no.: [id number]  Implant name: PATELLA MEDIAL ATTUN KNEE - [id number] Laterality: Right Area: Knee   Manufacturer: [employer] Date of Manufacture:   Action: Implanted Number Used: 1   Device Identifier: Device Identifier Type:   Procedure: After induction of spinal anesthesia lateral post heel bump proximal thigh tourniquet DuraPrep and on the tip of the toes usual total knee sheets draped sterile skin marker Betadine Steri-Drape was applied with impervious stockinette and Coban.,  Procedure completed IV TXA.  Leg was wrapped in Esmarch tourniquet inflated 300.  Midline incision was made medial parapatellar to incision.  Quad tendon split between middle one third lateral two thirds patella everted 10 mm resected.  Intramedullary hole made in the femur 10 mm resected 10 on the tibia spacer block for 5 mm spacer gave full extension once the meniscal remnants were removed.  There were 2 or 3 small loose bodies that were 1 to 1.5 cm that were in the intercondylar notch and posterior that were removed.  Meniscal remnants posterior spurs removed off condyle chamfer cuts were made size 5 same as her opposite left knee a few years ago.  Size 5 tibia.  35 mm patella lug knots were drilled on the femur pulsatile lavage vacuum mixing of the cement.  Tibia cemented first Bobi femur placement of the permanent 5 mm poly with full extension and patella was cemented held with a clamp.  All excessive cement was removed.  Cement was hardened 15 minutes tourniquet deflated hemostasis with the cautery and then standard layered closure closing quad tendon with interrupted #1 Vicryl 2-0 Vicryl subtenons tissue superficial retinaculum and then skin closure postop dressing.

## 2023-07-18 NOTE — Anesthesia Procedure Notes (Signed)
Anesthesia Regional Block: Adductor canal block   Pre-Anesthetic Checklist: , timeout performed,  Correct Patient, Correct Site, Correct Laterality,  Correct Procedure, Correct Position, site marked,  Risks and benefits discussed,  Surgical consent,  Pre-op evaluation,  At surgeon's request and post-op pain management  Laterality: Right  Prep: chloraprep       Needles:  Injection technique: Single-shot  Needle Type: Echogenic Needle     Needle Length: 10cm  Needle Gauge: 21     Additional Needles:   Narrative:  Start time: 07/18/2023 2:26 PM End time: 07/18/2023 2:29 PM Injection made incrementally with aspirations every 5 mL.  Performed by: Personally  Anesthesiologist: Beryle Lathe, MD  Additional Notes: No pain on injection. No increased resistance to injection. Injection made in 5cc increments. Good needle visualization. Patient tolerated the procedure well.

## 2023-07-19 ENCOUNTER — Encounter (HOSPITAL_COMMUNITY): Payer: Self-pay | Admitting: Orthopaedic Surgery

## 2023-07-19 DIAGNOSIS — Z886 Allergy status to analgesic agent status: Secondary | ICD-10-CM | POA: Diagnosis not present

## 2023-07-19 DIAGNOSIS — M1711 Unilateral primary osteoarthritis, right knee: Secondary | ICD-10-CM | POA: Diagnosis present

## 2023-07-19 DIAGNOSIS — Z7982 Long term (current) use of aspirin: Secondary | ICD-10-CM | POA: Diagnosis not present

## 2023-07-19 DIAGNOSIS — F17210 Nicotine dependence, cigarettes, uncomplicated: Secondary | ICD-10-CM | POA: Diagnosis present

## 2023-07-19 DIAGNOSIS — J449 Chronic obstructive pulmonary disease, unspecified: Secondary | ICD-10-CM | POA: Diagnosis present

## 2023-07-19 DIAGNOSIS — Z90710 Acquired absence of both cervix and uterus: Secondary | ICD-10-CM | POA: Diagnosis not present

## 2023-07-19 DIAGNOSIS — Z8619 Personal history of other infectious and parasitic diseases: Secondary | ICD-10-CM | POA: Diagnosis not present

## 2023-07-19 DIAGNOSIS — Z8701 Personal history of pneumonia (recurrent): Secondary | ICD-10-CM | POA: Diagnosis not present

## 2023-07-19 DIAGNOSIS — M179 Osteoarthritis of knee, unspecified: Secondary | ICD-10-CM | POA: Diagnosis present

## 2023-07-19 DIAGNOSIS — E119 Type 2 diabetes mellitus without complications: Secondary | ICD-10-CM | POA: Diagnosis present

## 2023-07-19 DIAGNOSIS — Z79899 Other long term (current) drug therapy: Secondary | ICD-10-CM | POA: Diagnosis not present

## 2023-07-19 DIAGNOSIS — I10 Essential (primary) hypertension: Secondary | ICD-10-CM | POA: Diagnosis present

## 2023-07-19 DIAGNOSIS — Z952 Presence of prosthetic heart valve: Secondary | ICD-10-CM | POA: Diagnosis not present

## 2023-07-19 DIAGNOSIS — M069 Rheumatoid arthritis, unspecified: Secondary | ICD-10-CM | POA: Diagnosis present

## 2023-07-19 DIAGNOSIS — G8929 Other chronic pain: Secondary | ICD-10-CM | POA: Diagnosis present

## 2023-07-19 DIAGNOSIS — Z7901 Long term (current) use of anticoagulants: Secondary | ICD-10-CM | POA: Diagnosis not present

## 2023-07-19 DIAGNOSIS — I4892 Unspecified atrial flutter: Secondary | ICD-10-CM | POA: Diagnosis present

## 2023-07-19 DIAGNOSIS — Z96652 Presence of left artificial knee joint: Secondary | ICD-10-CM | POA: Diagnosis present

## 2023-07-19 DIAGNOSIS — M797 Fibromyalgia: Secondary | ICD-10-CM | POA: Diagnosis present

## 2023-07-19 DIAGNOSIS — Z9981 Dependence on supplemental oxygen: Secondary | ICD-10-CM | POA: Diagnosis not present

## 2023-07-19 DIAGNOSIS — G4730 Sleep apnea, unspecified: Secondary | ICD-10-CM | POA: Diagnosis present

## 2023-07-19 DIAGNOSIS — Z9861 Coronary angioplasty status: Secondary | ICD-10-CM | POA: Diagnosis not present

## 2023-07-19 DIAGNOSIS — F319 Bipolar disorder, unspecified: Secondary | ICD-10-CM | POA: Diagnosis present

## 2023-07-19 DIAGNOSIS — Z79891 Long term (current) use of opiate analgesic: Secondary | ICD-10-CM | POA: Diagnosis not present

## 2023-07-19 LAB — CBC
HCT: 33.6 % — ABNORMAL LOW (ref 36.0–46.0)
Hemoglobin: 10.8 g/dL — ABNORMAL LOW (ref 12.0–15.0)
MCH: 32.1 pg (ref 26.0–34.0)
MCHC: 32.1 g/dL (ref 30.0–36.0)
MCV: 100 fL (ref 80.0–100.0)
Platelets: 186 10*3/uL (ref 150–400)
RBC: 3.36 MIL/uL — ABNORMAL LOW (ref 3.87–5.11)
RDW: 14 % (ref 11.5–15.5)
WBC: 7.9 10*3/uL (ref 4.0–10.5)
nRBC: 0 % (ref 0.0–0.2)

## 2023-07-19 LAB — BASIC METABOLIC PANEL
Anion gap: 9 (ref 5–15)
BUN: 6 mg/dL (ref 6–20)
CO2: 22 mmol/L (ref 22–32)
Calcium: 8.4 mg/dL — ABNORMAL LOW (ref 8.9–10.3)
Chloride: 104 mmol/L (ref 98–111)
Creatinine, Ser: 0.97 mg/dL (ref 0.44–1.00)
GFR, Estimated: >60 mL/min (ref >60)
Glucose, Bld: 141 mg/dL — ABNORMAL HIGH (ref 70–99)
Potassium: 3.9 mmol/L (ref 3.5–5.1)
Sodium: 135 mmol/L (ref 135–145)

## 2023-07-19 MED ORDER — OXYCODONE-ACETAMINOPHEN 5-325 MG PO TABS
2.0000 | ORAL_TABLET | ORAL | Status: DC | PRN
  Administered 2023-07-19: 3 via ORAL
  Filled 2023-07-19 (×2): qty 3

## 2023-07-19 NOTE — Evaluation (Signed)
Occupational Therapy Evaluation Patient Details Name: Lydia [name] MRN: [id number] DOB: April 14, 1966 Today's Date: 07/19/2023   History of Present Illness The pt is a 57 yo female presenting 9/18 for R TKA. PMH includes: hallucinations, depression, lumbar back pain, L TKA, and COPD.   Clinical Impression   Pt s/p above diagnosis, in a lot of pain today, FACES 8/10, Pt request for pain meds at end of session, tearful. Pt PLOF independent, lives in 1 story house, 3 steps to enter, with daughter and boyfriend who can assist 24/7. Pt currently requires increased assistance for bed mobility and transfers/mobility, significant assistance for LB ADLs. Pt has all DME and support at home needed to remain safe and independent, has BSC, shower chair, w/c if needed, and rollator. Pt would benefit from [facility] to maximize safety/independence in home, will be seen acutely to progress as able.       If plan is discharge home, recommend the following: A little help with walking and/or transfers;A little help with bathing/dressing/bathroom;Assistance with cooking/housework;Assist for transportation;Help with stairs or ramp for entrance    Functional Status Assessment  Patient has had a recent decline in their functional status and demonstrates the ability to make significant improvements in function in a reasonable and predictable amount of time.  Equipment Recommendations  None recommended by OT    Recommendations for Other Services       Precautions / Restrictions Precautions Precautions: Fall Required Braces or Orthoses: Knee Immobilizer - Right Knee Immobilizer - Right: On when out of bed or walking Restrictions Weight Bearing Restrictions: Yes RLE Weight Bearing: Weight bearing as tolerated      Mobility Bed Mobility Overal bed mobility: Needs Assistance Bed Mobility: Supine to Sit, Sit to Supine     Supine to sit: Min assist Sit to supine: Min assist   General bed mobility comments: min  A for RLE    Transfers Overall transfer level: Needs assistance Equipment used: Rolling walker (2 wheels) Transfers: Sit to/from Stand Sit to Stand: Min assist           General transfer comment: minA to rise and steady, minimal wt on RLE      Balance Overall balance assessment: Needs assistance Sitting-balance support: No upper extremity supported Sitting balance-Leahy Scale: Fair Sitting balance - Comments: difficulty bending down or L/R due to pain in RLE   Standing balance support: Bilateral upper extremity supported, Reliant on assistive device for balance Standing balance-Leahy Scale: Poor Standing balance comment: dependent on BUE support due to pain in RLE                           ADL either performed or assessed with clinical judgement   ADL Overall ADL's : Needs assistance/impaired Eating/Feeding: Independent   Grooming: Set up;Sitting   Upper Body Bathing: Minimal assistance;Sitting   Lower Body Bathing: Maximal assistance;Sitting/lateral leans   Upper Body Dressing : Minimal assistance;Sitting   Lower Body Dressing: Maximal assistance;Sitting/lateral leans   Toilet Transfer: Minimal assistance;BSC/3in1;Rolling walker (2 wheels)   Toileting- Clothing Manipulation and Hygiene: Minimal assistance;Sitting/lateral lean         General ADL Comments: Pt in a lot of pain today, max A for LB ADLs, min A for transfers/STS     Vision Baseline Vision/History: 1 Wears glasses Ability to See in Adequate Light: 0 Adequate Patient Visual Report: No change from baseline       Perception  Praxis         Pertinent Vitals/Pain Pain Assessment Pain Assessment: Faces Faces Pain Scale: Hurts even more Pain Location: R knee Pain Descriptors / Indicators: Discomfort, Grimacing, Guarding Pain Intervention(s): Monitored during session     Extremity/Trunk Assessment Upper Extremity Assessment Upper Extremity Assessment: Generalized  weakness           Communication Communication Communication: No apparent difficulties   Cognition Arousal: Lethargic, Alert Behavior During Therapy: Flat affect Overall Cognitive Status: Impaired/Different from baseline Area of Impairment: Orientation, Attention                 Orientation Level: Time Current Attention Level: Alternating           General Comments: difficulty recalling todays date/year, increased time to answer questions, decreased attention span     General Comments  VSS on 4L O2 (pt's baseline)    Exercises     Shoulder Instructions      Home Living Family/patient expects to be discharged to:: Private residence Living Arrangements: Children Available Help at Discharge: Family;Available PRN/intermittently Type of Home: House Home Access: Stairs to enter [employer] of Steps: 3 Entrance Stairs-Rails: Right Home Layout: One level;Laundry or work area in basement     [employer] Shower/Tub: Chief Strategy Officer: Pharmacist, community: Yes   Home Equipment: BSC/3in1;Rollator (4 wheels);Shower seat;Wheelchair - manual   Additional Comments: Lives with daughter, boyfriend is available 24/7      Prior Functioning/Environment Prior Level of Function : Independent/Modified Independent;Driving             Mobility Comments: independent without use of DME, no falls ADLs Comments: independent        OT Problem List: Decreased strength;Decreased range of motion;Decreased activity tolerance;Impaired balance (sitting and/or standing);Pain      OT Treatment/Interventions: Self-care/ADL training;Therapeutic exercise;Energy conservation;DME and/or AE instruction;Therapeutic activities;Patient/family education    OT Goals(Current goals can be found in the care plan section) Acute Rehab OT Goals Patient Stated Goal: manage pain OT Goal Formulation: With patient Time For Goal Achievement: 08/02/23 Potential  to Achieve Goals: Good  OT Frequency: Min 1X/week    Co-evaluation              AM-PAC OT "6 Clicks" Daily Activity     Outcome Measure Help from another person eating meals?: None Help from another person taking care of personal grooming?: A Little Help from another person toileting, which includes using toliet, bedpan, or urinal?: A Little Help from another person bathing (including washing, rinsing, drying)?: A Lot Help from another person to put on and taking off regular upper body clothing?: A Little Help from another person to put on and taking off regular lower body clothing?: A Lot 6 Click Score: 17   End of Session Equipment Utilized During Treatment: Gait belt;Rolling walker (2 wheels) CPM Right Knee CPM Right Knee: Off Nurse Communication: Mobility status;Patient requests pain meds  Activity Tolerance: Patient limited by pain Patient left: in bed;with call bell/phone within reach;with family/visitor present;with nursing/sitter in room  OT Visit Diagnosis: Unsteadiness on feet (R26.81);Other abnormalities of gait and mobility (R26.89);Muscle weakness (generalized) (M62.81);Pain Pain - Right/Left: Right Pain - part of body: Knee                Time: 1535-1555 OT Time Calculation (min): 20 min Charges:  OT General Charges $OT Visit: 1 Visit OT Evaluation $OT Eval Moderate Complexity: 1 [id number] OTR/L   Alexis Goodell 07/19/2023,  4:03 PM

## 2023-07-19 NOTE — Progress Notes (Signed)
Physical Therapy Treatment Patient Details Name: Lydia [name] MRN: [id number] DOB: [birth year] Today's Date: 07/19/2023   History of Present Illness The pt is a 57 yo female presenting 9/18 for R TKA. PMH includes: hallucinations, depression, lumbar back pain, L TKA, and COPD.    PT Comments  Pt seen again this afternoon for mobility progression and review of exercises. Pt again lethargic upon arrival, was easier to arouse this afternoon. The pt continues to require minA to complete bed mobility and sit-stand transfers, needed totalA to manage knee immobilizer. Pt able to progress ambulation to ~20 ft in her room with use of RW, further mobility limited at this time due to pain. Pt attempted bed-level ROM exercises for RLE but pt limited by pain, knee flexion ROM more limited with AAROM in supine than when sitting EOB. Will continue to benefit from skilled PT acutely to progress walking distance, complete stair training, and increase independence with exercises.     If plan is discharge home, recommend the following: A little help with walking and/or transfers;A little help with bathing/dressing/bathroom;Assistance with cooking/housework;Assist for transportation;Help with stairs or ramp for entrance   Can travel by private vehicle        Equipment Recommendations  Rolling walker (2 wheels)    Recommendations for Other Services       Precautions / Restrictions Precautions Precautions: Fall Required Braces or Orthoses: Knee Immobilizer - Right Restrictions Weight Bearing Restrictions: Yes RLE Weight Bearing: Weight bearing as tolerated     Mobility  Bed Mobility Overal bed mobility: Needs Assistance Bed Mobility: Supine to Sit, Sit to Supine     Supine to sit: Min assist Sit to supine: Min assist   General bed mobility comments: minA to LLE    Transfers Overall transfer level: Needs assistance Equipment used: Rolling walker (2 wheels) Transfers: Sit to/from Stand Sit to  Stand: Min assist           General transfer comment: minA to rise and steady, minimal wt on RLE    Ambulation/Gait Ambulation/Gait assistance: Min assist Gait Distance (Feet): 20 Feet Assistive device: Rolling walker (2 wheels) Gait Pattern/deviations: Step-to pattern, Decreased stride length, Decreased stance time - right, Decreased weight shift to right, Shuffle Gait velocity: decreased Gait velocity interpretation: <1.31 ft/sec, indicative of household ambulator   General Gait Details: pt with small steps, minimal wt shift to RLE but improved from this morning. KI present      Balance Overall balance assessment: Needs assistance Sitting-balance support: No upper extremity supported Sitting balance-Leahy Scale: Fair     Standing balance support: Bilateral upper extremity supported, Reliant on assistive device for balance Standing balance-Leahy Scale: Poor Standing balance comment: dependent on BUE support due to pain in RLE                            Cognition Arousal: Lethargic, Alert Behavior During Therapy: Flat affect Overall Cognitive Status: Within Functional Limits for tasks assessed                                 General Comments: pt sleeping again upon arrival, awakes with stimulation but remains with flat affect        Exercises Total Joint Exercises Ankle Circles/Pumps: AROM, Right, 15 reps Goniometric ROM: reduced tolerance from measurements taken in sitting, 15-35 deg knee flexion    General Comments General comments (skin  integrity, edema, etc.): VSS on 4L O2 (pt's baseline)      Pertinent Vitals/Pain Pain Assessment Pain Assessment: Faces Pain Score: 10-Worst pain ever Faces Pain Scale: Hurts even more Pain Location: R knee (pt reports 9-10/10 in R knee, was premedicated and asleep upon arrival of PT) Pain Descriptors / Indicators: Discomfort, Grimacing, Guarding Pain Intervention(s): Limited activity within  patient's tolerance, Premedicated before session, Monitored during session, Repositioned    Home Living Family/patient expects to be discharged to:: Private residence Living Arrangements: Children Available Help at Discharge: Family;Available PRN/intermittently Type of Home: House Home Access: Stairs to enter Entrance Stairs-Rails: Right Entrance Stairs-Number of Steps: 3   Home Layout: One level;Laundry or work area in [employer]: BSC/3in1;Rollator (4 wheels)      Prior Function            PT Goals (current goals can now be found in the care plan section) Acute Rehab PT Goals Patient Stated Goal: return home and to independence PT Goal Formulation: With patient Time For Goal Achievement: 08/02/23 Potential to Achieve Goals: Good Progress towards PT goals: Progressing toward goals    Frequency    7X/week       AM-PAC PT "6 Clicks" Mobility   Outcome Measure  Help needed turning from your back to your side while in a flat bed without using bedrails?: A Little Help needed moving from lying on your back to sitting on the side of a flat bed without using bedrails?: A Little Help needed moving to and from a bed to a chair (including a wheelchair)?: A Little Help needed standing up from a chair using your arms (e.g., wheelchair or bedside chair)?: A Little Help needed to walk in hospital room?: A Little Help needed climbing 3-5 steps with a railing? : A Lot 6 Click Score: 17    End of Session Equipment Utilized During Treatment: Gait belt;Oxygen Activity Tolerance: Patient tolerated treatment well;Patient limited by lethargy Patient left: in bed;with call bell/phone within reach;with bed alarm set Nurse Communication: Mobility status PT Visit Diagnosis: Unsteadiness on feet (R26.81);Other abnormalities of gait and mobility (R26.89);Repeated falls (R29.6);Muscle weakness (generalized) (M62.81);Pain Pain - Right/Left: Right Pain - part of body: Knee      Time: 5366-4403 PT Time Calculation (min) (ACUTE ONLY): 31 min  Charges:    $Gait Training: 8-22 mins $Therapeutic Exercise: 8-22 mins PT General Charges $$ ACUTE PT VISIT: 1 Visit                     Vickki Muff, PT, DPT   Acute Rehabilitation Department Office [phone] Secure Chat Communication Preferred   Lydia [name] 07/19/2023, 2:31 PM

## 2023-07-19 NOTE — Plan of Care (Addendum)
Pt given PRN pain medication Q4h today. States pain medication does not decrease pain at all. Pt ambulated with nurse xs 1 from chair to bathroom and bathroom to bed. Pt encouraged multiple times to order food, pt states "I'm not much of an eater, just a pepsi drinker." Pt eating 50% of meals or less.

## 2023-07-19 NOTE — Progress Notes (Signed)
Orthopedic Tech Progress Note Patient Details:  Lydia [name] 07-04-1966 [id number]  Applied CPM to patient this morning @0600  with husband at bedside. Patient did have a small bend in the bed. I removed that and told her how important it is not to have anything behind knee. Added fresh ice to [facility] then applied. Patient could not handle 60 degrees so I dropped it back down to 50. CPM Right Knee CPM Right Knee: On Right Knee Flexion (Degrees): 0 Right Knee Extension (Degrees): 50 Additional Comments: fresh ice to ice man  Post Interventions Patient Tolerated: Well Instructions Provided: Care of device  Donald Pore 07/19/2023, 6:08 AM

## 2023-07-19 NOTE — Progress Notes (Signed)
Orthopedic Tech Progress Note Patient Details:  Lydia [name] 03/20/1966 [id number]  Ortho Devices Type of Ortho Device: Knee Immobilizer Ortho Device/Splint Interventions: Ordered   Post Interventions Patient Tolerated: Well Instructions Provided: Care of device  [name] [name] 07/19/2023, 2:19 PM

## 2023-07-19 NOTE — Progress Notes (Signed)
Patient ID: Lydia [name], female   DOB: [birth year], 57 y.o.   MRN: [id number]   Subjective: 1 Day Post-Op Procedure(s) (LRB): RIGHT TOTAL KNEE ARTHROPLASTY (Right) Patient reports pain as severe.    Objective: Vital signs in last 24 hours: Temp:  [97.7 F (36.5 C)-98.6 F (37 C)] 98.2 F (36.8 C) (09/19 0329) Pulse Rate:  [61-99] 99 (09/19 0646) Resp:  [11-22] 19 (09/19 0646) BP: (86-153)/(54-141) 118/77 (09/19 0646) SpO2:  [90 %-100 %] 94 % (09/19 0731) Weight:  [77.6 kg] 77.6 kg (09/18 1342)  Intake/Output from previous day: 09/18 0701 - 09/19 0700 In: 1200 [I.V.:1200] Out: 1625 [Urine:1600; Blood:25] Intake/Output this shift: No intake/output data recorded.  Recent Labs    07/19/23 0411  HGB 10.8*   Recent Labs    07/19/23 0411  WBC 7.9  RBC 3.36*  HCT 33.6*  PLT 186   Recent Labs    07/19/23 0411  NA 135  K 3.9  CL 104  CO2 22  BUN 6  CREATININE 0.97  GLUCOSE 141*  CALCIUM 8.4*   No results for input(s): "LABPT", "INR" in the last 72 hours.  Neurologically intact No results found.  Assessment/Plan: 1 Day Post-Op Procedure(s) (LRB): RIGHT TOTAL KNEE ARTHROPLASTY (Right) Up with therapy. Patient states she stopped suboxone 2 wks ago and does not want to use it. Cancelled from orders suboxone and percocet 10-15mg  dose.   Eldred Manges 07/19/2023, 7:36 AM

## 2023-07-19 NOTE — Evaluation (Signed)
Physical Therapy Evaluation Patient Details Name: Lydia [name] MRN: [id number] DOB: [birth year] Today's Date: 07/19/2023  History of Present Illness  The pt is a 57 yo female presenting 9/18 for R TKA. PMH includes: hallucinations, depression, lumbar back pain, L TKA, and COPD.   Clinical Impression  Pt in bed upon arrival of PT, agreeable to evaluation at this time. Prior to admission the pt was independent without use of DME, reports no recent falls. The pt typically lives with her daughter and granddaughter, is home alone during the day but has friends who can provide additional support throughout the day as needed. The pt required minA to complete bed mobility and initial sit-stand transfers this session to manage RLE and pain in R knee, and was limited to ~3-4 ft ambulation in the room prior to needing to sit. The pt was also quite lethargic this morning, needing max stimulation, sternal rub, and continued prompting to stay awake, improved with mobility. Will further assess cognition in future sessions. Pt will continue to benefit from skilled PT during admission to progress walking distance and practice stair training as needed to enter her home (3 steps to enter).       If plan is discharge home, recommend the following: A little help with walking and/or transfers;A little help with bathing/dressing/bathroom;Assistance with cooking/housework;Assist for transportation;Help with stairs or ramp for entrance   Can travel by private vehicle        Equipment Recommendations Rolling walker (2 wheels)  Recommendations for Other Services       Functional Status Assessment Patient has had a recent decline in their functional status and demonstrates the ability to make significant improvements in function in a reasonable and predictable amount of time.     Precautions / Restrictions Precautions Precautions: Fall Restrictions Weight Bearing Restrictions: Yes RLE Weight Bearing: Weight  bearing as tolerated      Mobility  Bed Mobility Overal bed mobility: Needs Assistance Bed Mobility: Supine to Sit     Supine to sit: Min assist     General bed mobility comments: minA to LLE    Transfers Overall transfer level: Needs assistance Equipment used: Rolling walker (2 wheels) Transfers: Sit to/from Stand Sit to Stand: Min assist           General transfer comment: minA to rise and steady, minimal wt on RLE    Ambulation/Gait Ambulation/Gait assistance: Min assist Gait Distance (Feet): 3 Feet Assistive device: Rolling walker (2 wheels) Gait Pattern/deviations: Step-to pattern, Decreased stride length, Decreased stance time - right, Decreased weight shift to right, Shuffle Gait velocity: decreased Gait velocity interpretation: <1.31 ft/sec, indicative of household ambulator   General Gait Details: pt with small steps, minimal wt shift to RLE     Balance Overall balance assessment: Needs assistance Sitting-balance support: No upper extremity supported Sitting balance-Leahy Scale: Fair     Standing balance support: Bilateral upper extremity supported, Reliant on assistive device for balance Standing balance-Leahy Scale: Poor Standing balance comment: dependent on BUE support due to pain in RLE                             Pertinent Vitals/Pain Pain Assessment Pain Assessment: Faces Pain Score: 10-Worst pain ever Faces Pain Scale: Hurts even more Pain Location: R knee (pt reports 9-10/10 in R knee, was premedicated and asleep upon arrival of PT) Pain Descriptors / Indicators: Discomfort, Grimacing, Guarding Pain Intervention(s): Limited activity within patient's tolerance, Monitored  during session, Premedicated before session, Repositioned, Ice applied    Home Living Family/patient expects to be discharged to:: Private residence Living Arrangements: Children Available Help at Discharge: Family;Available PRN/intermittently Type of Home:  House Home Access: Stairs to enter Entrance Stairs-Rails: Right Entrance Stairs-Number of Steps: 3   Home Layout: One level;Laundry or work area in [employer]: BSC/3in1;Rollator (4 wheels)      Prior Function Prior Level of Function : Independent/Modified Independent;Driving             Mobility Comments: independent without use of DME, no falls ADLs Comments: independent     Extremity/Trunk Assessment   Upper Extremity Assessment Upper Extremity Assessment: Defer to OT evaluation    Lower Extremity Assessment Lower Extremity Assessment: RLE deficits/detail RLE Deficits / Details: limitd ROM and strength due to pain. RLE in ace wrap. trace activation in quad and HS, pt able to move toes. ROM 15-53 deg knee flexion RLE: Unable to fully assess due to pain RLE Sensation: WNL RLE Coordination: WNL    Cervical / Trunk Assessment Cervical / Trunk Assessment: Normal  Communication   Communication Communication: No apparent difficulties Cueing Techniques: Verbal cues  Cognition Arousal: Lethargic, Alert Behavior During Therapy: Flat affect Overall Cognitive Status: Within Functional Limits for tasks assessed                                 General Comments: pt initially difficult to arouse, but with continued stimulation and sternal rub did wake up and was able to maintain. often closing eyes in session. answering questions appropriately, slight delay. will continue to assess as she is more alert        General Comments General comments (skin integrity, edema, etc.): VSS on 4L O2 (pt's baseline)    Exercises Total Joint Exercises Ankle Circles/Pumps: AROM, Right, 10 reps Long Arc Quad: AAROM, Right, 10 reps, Seated Knee Flexion: AAROM, Right, 10 reps, Seated Goniometric ROM: 15-53 deg knee flexion   Assessment/Plan    PT Assessment Patient needs continued PT services  PT Problem List Decreased strength;Decreased range of  motion;Decreased activity tolerance;Decreased balance;Decreased mobility;Decreased cognition;Decreased safety awareness       PT Treatment Interventions DME instruction;Gait training;Stair training;Functional mobility training;Therapeutic activities;Therapeutic exercise;Balance training;Patient/family education    PT Goals (Current goals can be found in the Care Plan section)  Acute Rehab PT Goals Patient Stated Goal: return home and to independence PT Goal Formulation: With patient Time For Goal Achievement: 08/02/23 Potential to Achieve Goals: Good    Frequency 7X/week        AM-PAC PT "6 Clicks" Mobility  Outcome Measure Help needed turning from your back to your side while in a flat bed without using bedrails?: A Little Help needed moving from lying on your back to sitting on the side of a flat bed without using bedrails?: A Little Help needed moving to and from a bed to a chair (including a wheelchair)?: A Little Help needed standing up from a chair using your arms (e.g., wheelchair or bedside chair)?: A Little Help needed to walk in hospital room?: Total (<20 ft ambulation) Help needed climbing 3-5 steps with a railing? : A Lot 6 Click Score: 15    End of Session Equipment Utilized During Treatment: Gait belt;Oxygen Activity Tolerance: Patient tolerated treatment well;Patient limited by lethargy Patient left: in chair;with call bell/phone within reach;with chair alarm set Nurse Communication: Mobility status PT Visit Diagnosis: Unsteadiness  on feet (R26.81);Other abnormalities of gait and mobility (R26.89);Repeated falls (R29.6);Muscle weakness (generalized) (M62.81);Pain Pain - Right/Left: Right Pain - part of body: Knee    Time: 1610-9604 PT Time Calculation (min) (ACUTE ONLY): 37 min   Charges:   PT Evaluation $PT Eval Low Complexity: 1 Low PT Treatments $Therapeutic Exercise: 8-22 mins PT General Charges $$ ACUTE PT VISIT: 1 Visit         Vickki Muff, PT,  DPT   Acute Rehabilitation Department Office [phone] Secure Chat Communication Preferred  Ronnie Derby 07/19/2023, 12:48 PM

## 2023-07-19 NOTE — Plan of Care (Signed)
Problem: Clinical Measurements: Goal: Will remain free from infection Outcome: Progressing Goal: Diagnostic test results will improve Outcome: Progressing Goal: Respiratory complications will improve Outcome: Progressing Goal: Cardiovascular complication will be avoided Outcome: Progressing   Problem: Activity: Goal: Risk for activity intolerance will decrease Outcome: Progressing   Problem: Nutrition: Goal: Adequate nutrition will be maintained Outcome: Progressing   Problem: Coping: Goal: Level of anxiety will decrease Outcome: Progressing   Problem: Elimination: Goal: Will not experience complications related to bowel motility Outcome: Progressing

## 2023-07-19 NOTE — TOC Initial Note (Signed)
Transition of Care [facility]) - Initial/Assessment Note    Patient Details  Name: Lydia [name] MRN: [id number] Date of Birth: Aug 11, 1966  [facility] of [facility]) CM/SW Contact:    Epifanio Lesches, RN Phone Number: 07/19/2023, 9:49 AM  Clinical Narrative:                    - s/p R TKA, 9/18 From home with significant other. PTA independent with ADL's.  Has RW , BSC @ home. Pt is oxygen dependent, provider [facility]. Pt states has 3 steps to  enter home. Pt agreeable to home health services. Pt without provider preference. Referral made with [facility] and accepted.  Pt states SGO to assist with care once d/c.   Pt without transportation issues.  PT/OT evaluations pending...  TOC team will continue monitoring and assist with needs.  Expected Discharge Plan: Home w Home Health Services Barriers to Discharge: Continued Medical Work up   Patient Goals and CMS Choice     Choice offered to / list presented to : Patient      Expected Discharge Plan and Services   Discharge Planning Services: CM Consult   Living arrangements for the past 2 months: Single Family Home                           HH Arranged: PT HH Agency: [facility] Date [facility] Agency Contacted: 07/19/23 Time HH Agency Contacted: [phone] Representative spoke with at [facility] Agency: Tresa Endo  Prior Living Arrangements/Services Living arrangements for the past 2 months: Single Family Home Lives with:: Significant Other Patient language and need for interpreter reviewed:: Yes Do you feel safe going back to the place where you live?: Yes      Need for Family Participation in Patient Care: Yes (Comment) Care giver support system in place?: Yes (comment) Current home services: DME (RW, BSC, oxygen/ [employer]) Criminal Activity/Legal Involvement Pertinent to Current Situation/Hospitalization: No - Comment as needed  Activities of Daily Living      Permission Sought/Granted                   Emotional Assessment Appearance:: Appears stated age Attitude/Demeanor/Rapport: Engaged Affect (typically observed): Accepting Orientation: : Oriented to Self, Oriented to Place, Oriented to  Time, Oriented to Situation Alcohol / Substance Use: Not Applicable Psych Involvement: No (comment)  Admission diagnosis:  OA (osteoarthritis) of knee [M17.9] S/P total knee arthroplasty, right [Z96.651] Patient Active Problem List   Diagnosis Date Noted   OA (osteoarthritis) of knee 07/18/2023   S/P total knee arthroplasty, right 07/18/2023   Unilateral primary osteoarthritis, right knee 05/08/2023   Hallucination 09/15/2021   Depression 09/15/2021   Other intervertebral disc degeneration, lumbar region 04/14/2021   S/P total knee arthroplasty 01/01/2020   CAD (coronary artery disease) 02/01/2016   Sepsis (HCC) 01/29/2016   Respiratory failure (HCC) 01/29/2016   Influenza    Chronic obstructive pulmonary disease with acute exacerbation (HCC)    Cough    PCP:  Erskine Emery, NP Pharmacy:   [facility] [phone] Chestine Spore, [place] - [address] AT [facility] [address] [place] [address] Phone: [phone] Fax: [phone]     Social Determinants of Health (SDOH) Social History: SDOH Screenings   Food Insecurity: Low Risk  (12/24/2022)   Received from [facility]  Transportation Needs: No Transportation Needs (12/24/2022)   Received from [facility]  Utilities: Low Risk  (12/24/2022)   [facility]  Tobacco Use: High Risk (07/18/2023)   SDOH Interventions:     Readmission Risk Interventions     No data to display

## 2023-07-19 NOTE — Progress Notes (Signed)
Patient ID: Lydia [name], female   DOB: 12/26/1965, 57 y.o.   MRN: [id number] With PT only did 3 steps and needed sternal rub to arouse for PT.  Nurse earlier got her to BR.  Not ready for discharge. Changed to admit. Will D/C dilaudid. PO percocet for pain. Hopefully will do better tomorrow with mobility.

## 2023-07-19 NOTE — Anesthesia Postprocedure Evaluation (Signed)
Anesthesia Post Note  Patient: Lydia [name]  Procedure(s) Performed: RIGHT TOTAL KNEE ARTHROPLASTY (Right: Knee)     Patient location during evaluation: PACU Anesthesia Type: Spinal Level of consciousness: awake and alert Pain management: pain level controlled Vital Signs Assessment: post-procedure vital signs reviewed and stable Respiratory status: spontaneous breathing and respiratory function stable Cardiovascular status: blood pressure returned to baseline and stable Postop Assessment: spinal receding and no apparent nausea or vomiting Anesthetic complications: no   No notable events documented.  Last Vitals:  Vitals:   07/19/23 0329 07/19/23 0646  BP: (!) 153/141 118/77  Pulse: 98 99  Resp: 20 19  Temp: 36.8 C   SpO2: 96% 94%    Last Pain:  Vitals:   07/19/23 0513  TempSrc:   PainSc: Asleep                 Beryle Lathe

## 2023-07-20 ENCOUNTER — Encounter: Payer: 59 | Admitting: Orthopaedic Surgery

## 2023-07-20 LAB — CBC
HCT: 30 % — ABNORMAL LOW (ref 36.0–46.0)
Hemoglobin: 9.8 g/dL — ABNORMAL LOW (ref 12.0–15.0)
MCH: 31.8 pg (ref 26.0–34.0)
MCHC: 32.7 g/dL (ref 30.0–36.0)
MCV: 97.4 fL (ref 80.0–100.0)
Platelets: 172 10*3/uL (ref 150–400)
RBC: 3.08 MIL/uL — ABNORMAL LOW (ref 3.87–5.11)
RDW: 14.1 % (ref 11.5–15.5)
WBC: 7.7 10*3/uL (ref 4.0–10.5)
nRBC: 0 % (ref 0.0–0.2)

## 2023-07-20 MED ORDER — OXYCODONE-ACETAMINOPHEN 10-325 MG PO TABS: 1.0000 | ORAL_TABLET | Freq: Four times a day (QID) | ORAL | 0 refills | Status: DC | PRN

## 2023-07-20 NOTE — Progress Notes (Signed)
Physical Therapy Treatment Patient Details Name: Lydia [name] MRN: [id number] DOB: September 26, 1966 Today's Date: 07/20/2023   History of Present Illness The pt is a 57 yo female presenting 9/18 for R TKA. PMH includes: hallucinations, depression, lumbar back pain, L TKA, and COPD.    PT Comments  The pt was more alert this morning, initially resistant to mobility attempts due to pain but eventually agreeable. The pt continues to required minA to complete bed mobility, with totalA to don KI and assist to manage all movement of RLE into and out of bed. The pt was able to use RW and progress walking distance significantly, is very limited in ROM and is not tolerating attempts at PROM or AAROM well. Therefore, pt left in CPM 0-55 deg. Will return this afternoon for continued mobility and stair training.     If plan is discharge home, recommend the following: A little help with walking and/or transfers;A little help with bathing/dressing/bathroom;Assistance with cooking/housework;Assist for transportation;Help with stairs or ramp for entrance   Can travel by private vehicle        Equipment Recommendations  Rolling walker (2 wheels)    Recommendations for Other Services       Precautions / Restrictions Precautions Precautions: Fall Precaution Comments: 4L O2 at baseline Required Braces or Orthoses: Knee Immobilizer - Right Knee Immobilizer - Right: On when out of bed or walking Restrictions Weight Bearing Restrictions: Yes RLE Weight Bearing: Weight bearing as tolerated     Mobility  Bed Mobility Overal bed mobility: Needs Assistance Bed Mobility: Supine to Sit, Sit to Supine     Supine to sit: Min assist, HOB elevated, Used rails Sit to supine: Min assist, Mod assist, Used rails, HOB elevated   General bed mobility comments: minA to LLE to reach EOB, modA to BLE to return to supine    Transfers Overall transfer level: Needs assistance Equipment used: Rolling walker (2  wheels) Transfers: Sit to/from Stand Sit to Stand: Min assist           General transfer comment: minA to rise and steady, minimal wt on RLE    Ambulation/Gait Ambulation/Gait assistance: Contact guard assist, Min assist Gait Distance (Feet): 75 Feet Assistive device: Rolling walker (2 wheels) Gait Pattern/deviations: Step-to pattern, Decreased stride length, Decreased stance time - right, Decreased weight shift to right, Shuffle Gait velocity: decreased     General Gait Details: pt with small steps, minimal wt shift to RLE but improved from this morning. KI present     Balance Overall balance assessment: Needs assistance Sitting-balance support: No upper extremity supported Sitting balance-Leahy Scale: Fair     Standing balance support: Bilateral upper extremity supported, Reliant on assistive device for balance Standing balance-Leahy Scale: Poor Standing balance comment: dependent on BUE support due to pain in RLE                            Cognition Arousal: Alert Behavior During Therapy: Flat affect Overall Cognitive Status: Within Functional Limits for tasks assessed                                 General Comments: pt resting upon arrival, but able to arouse. asking to hold mobility due to pain but eventually agreeable. flat affect, moaning through most of session. needs significant encouragement to participate in mobility        Exercises Total  Joint Exercises Ankle Circles/Pumps: AROM, Right, 15 reps Heel Slides: PROM, Right, 10 reps, Limitations Heel Slides Limitations: pt resisting knee flexion Knee Flexion: PROM, Right, 10 reps, Seated    General Comments General comments (skin integrity, edema, etc.): VSS on 4L O2      Pertinent Vitals/Pain Pain Assessment Pain Assessment: Faces Pain Score: 10-Worst pain ever Faces Pain Scale: Hurts worst Pain Location: R knee (pt reports 9-10/10 in R knee) Pain Descriptors / Indicators:  Discomfort, Grimacing, Guarding, Sore, Moaning Pain Intervention(s): Limited activity within patient's tolerance, Monitored during session, RN gave pain meds during session, Ice applied     PT Goals (current goals can now be found in the care plan section) Acute Rehab PT Goals Patient Stated Goal: return home and to independence PT Goal Formulation: With patient Time For Goal Achievement: 08/02/23 Potential to Achieve Goals: Good Progress towards PT goals: Progressing toward goals    Frequency    7X/week       AM-PAC PT "6 Clicks" Mobility   Outcome Measure  Help needed turning from your back to your side while in a flat bed without using bedrails?: A Little Help needed moving from lying on your back to sitting on the side of a flat bed without using bedrails?: A Little Help needed moving to and from a bed to a chair (including a wheelchair)?: A Little Help needed standing up from a chair using your arms (e.g., wheelchair or bedside chair)?: A Little Help needed to walk in hospital room?: A Little Help needed climbing 3-5 steps with a railing? : A Lot 6 Click Score: 17    End of Session Equipment Utilized During Treatment: Gait belt;Oxygen Activity Tolerance: Patient tolerated treatment well;Patient limited by pain Patient left: in bed;with bed alarm set;in CPM;with call bell/phone within reach Nurse Communication: Mobility status PT Visit Diagnosis: Unsteadiness on feet (R26.81);Other abnormalities of gait and mobility (R26.89);Repeated falls (R29.6);Muscle weakness (generalized) (M62.81);Pain Pain - Right/Left: Right Pain - part of body: Knee     Time: 4098-1191 PT Time Calculation (min) (ACUTE ONLY): 37 min  Charges:    $Gait Training: 8-22 mins $Therapeutic Exercise: 8-22 mins PT General Charges $$ ACUTE PT VISIT: 1 Visit                     Vickki Muff, PT, DPT   Acute Rehabilitation Department Office [phone] Secure Chat Communication  Preferred   Ronnie Derby 07/20/2023, 1:21 PM

## 2023-07-20 NOTE — Progress Notes (Signed)
Physical Therapy Treatment Patient Details Name: Lydia [name] MRN: [id number] DOB: [birth year] Today's Date: 07/20/2023   History of Present Illness The pt is a 57 yo female presenting 9/18 for R TKA. PMH includes: hallucinations, depression, lumbar back pain, L TKA, and COPD.    PT Comments  The pt continues to make slow but steady progress with OOB mobility. She was more limited this afternoon due to fatigue, but reports pain is under control. The pt was able to rise from recliner with CGA, but remained dependent on minA to rise from lower toilet. Pt also needing cues for safety regarding turning to sit on chair or toilet. Attempted stair training, but pt unable to lift LLE to step despite BUE support and assist, then discussed use of WC and family assist to get pt into home and her friend who was present agreed with plan. Pt and friend given handout, they state this is how she got into the home after the last knee replacement and are able to provide the physical support needed. The pt is able to walk household distances and can enter/leave her home with use of WC and assist of friends/family, is able to return home. Will continue to benefit from skilled PT to progress functional ROM and strength in RLE.     If plan is discharge home, recommend the following: A little help with walking and/or transfers;A little help with bathing/dressing/bathroom;Assistance with cooking/housework;Assist for transportation;Help with stairs or ramp for entrance   Can travel by private vehicle        Equipment Recommendations  Rolling walker (2 wheels)    Recommendations for Other Services       Precautions / Restrictions Precautions Precautions: Fall Precaution Comments: 4L O2 at baseline Required Braces or Orthoses: Knee Immobilizer - Right Knee Immobilizer - Right: On when out of bed or walking Restrictions Weight Bearing Restrictions: Yes RLE Weight Bearing: Weight bearing as tolerated      Mobility  Bed Mobility Overal bed mobility: Needs Assistance Bed Mobility: Supine to Sit, Sit to Supine     Supine to sit: Min assist, HOB elevated, Used rails Sit to supine: Min assist, Mod assist, Used rails, HOB elevated   General bed mobility comments: pt OOB in chair at start and end of session    Transfers Overall transfer level: Needs assistance Equipment used: Rolling walker (2 wheels) Transfers: Sit to/from Stand Sit to Stand: Min assist, Contact guard assist           General transfer comment: minA from low toilet, CGA from chair    Ambulation/Gait Ambulation/Gait assistance: Contact guard assist Gait Distance (Feet): 25 Feet Assistive device: Rolling walker (2 wheels) Gait Pattern/deviations: Step-to pattern, Decreased stride length, Decreased stance time - right, Decreased weight shift to right, Shuffle Gait velocity: decreased     General Gait Details: pt with small steps, minimal wt shift to RLE but improved from this morning. KI present   Stairs Stairs: Yes   Stair Management: One rail Right, Sideways Number of Stairs: 1 General stair comments: pt attempted x3, unable to step LLE onto step despite assist and BUE support.       Balance Overall balance assessment: Needs assistance Sitting-balance support: No upper extremity supported Sitting balance-Leahy Scale: Fair     Standing balance support: Bilateral upper extremity supported, Reliant on assistive device for balance Standing balance-Leahy Scale: Poor Standing balance comment: dependent on BUE support due to pain in RLE  Cognition Arousal: Alert Behavior During Therapy: Flat affect Overall Cognitive Status: Impaired/Different from baseline Area of Impairment: Safety/judgement, Problem solving                         Safety/Judgement: Decreased awareness of safety, Decreased awareness of deficits   Problem Solving: Slow processing,  Decreased initiation, Difficulty sequencing General Comments: pt able to follow commands, poor insight and problem solving. patient needing max cues and visual demonstration to understand technique        Exercises Total Joint Exercises Ankle Circles/Pumps: AROM, Right, 15 reps Heel Slides: PROM, Right, 10 reps, Limitations Heel Slides Limitations: pt resisting knee flexion Knee Flexion: PROM, Right, 10 reps, Seated    General Comments General comments (skin integrity, edema, etc.): VSS on 4L O2      Pertinent Vitals/Pain Pain Assessment Pain Assessment: Faces Pain Score: 10-Worst pain ever Faces Pain Scale: Hurts even more Pain Location: R knee Pain Descriptors / Indicators: Discomfort, Grimacing, Guarding, Sore, Moaning Pain Intervention(s): Limited activity within patient's tolerance, Monitored during session, Premedicated before session, Repositioned     PT Goals (current goals can now be found in the care plan section) Acute Rehab PT Goals Patient Stated Goal: return home and to independence PT Goal Formulation: With patient Time For Goal Achievement: 08/02/23 Potential to Achieve Goals: Good Progress towards PT goals: Progressing toward goals    Frequency    7X/week       AM-PAC PT "6 Clicks" Mobility   Outcome Measure  Help needed turning from your back to your side while in a flat bed without using bedrails?: A Little Help needed moving from lying on your back to sitting on the side of a flat bed without using bedrails?: A Little Help needed moving to and from a bed to a chair (including a wheelchair)?: A Little Help needed standing up from a chair using your arms (e.g., wheelchair or bedside chair)?: A Little Help needed to walk in hospital room?: A Little Help needed climbing 3-5 steps with a railing? : A Lot 6 Click Score: 17    End of Session Equipment Utilized During Treatment: Gait belt;Oxygen Activity Tolerance: Patient tolerated treatment  well;Patient limited by pain Patient left: in chair;with call bell/phone within reach;with chair alarm set;with family/visitor present Nurse Communication: Mobility status PT Visit Diagnosis: Unsteadiness on feet (R26.81);Other abnormalities of gait and mobility (R26.89);Repeated falls (R29.6);Muscle weakness (generalized) (M62.81);Pain Pain - Right/Left: Right Pain - part of body: Knee     Time: 1502-1529 PT Time Calculation (min) (ACUTE ONLY): 27 min  Charges:    $Gait Training: 8-22 mins $Therapeutic Exercise: 8-22 mins PT General Charges $$ ACUTE PT VISIT: 1 Visit                     Vickki Muff, PT, DPT   Acute Rehabilitation Department Office [phone] Secure Chat Communication Preferred   Ronnie Derby 07/20/2023, 3:40 PM

## 2023-07-20 NOTE — Plan of Care (Signed)

## 2023-07-20 NOTE — Progress Notes (Signed)
Mobility Specialist: Progress Note   07/20/23 1517  Mobility  Activity Ambulated with assistance in room  Level of Assistance Minimal assist, patient does 75% or more  Assistive Device Four wheel walker  Distance Ambulated (ft) 3 ft  RLE Weight Bearing WBAT  Activity Response Tolerated well  Mobility Referral Yes  $Mobility charge 1 Mobility  Mobility Specialist Start Time (ACUTE ONLY) 1238  Mobility Specialist Stop Time (ACUTE ONLY) 1251  Mobility Specialist Time Calculation (min) (ACUTE ONLY) 13 min    Pt was agreeable to mobility session - received in bed. MS removed pt from CPM and donned KI brace. MinA for bed mobility to assist RLE, MinA for STS, CG for ambulation. Able to ambulated a couple feet to chair. Left in chair with all needs met, call bell in reach. KI brace left on.   Maurene Capes Mobility Specialist Please contact via SecureChat or Rehab office at [phone]

## 2023-07-20 NOTE — Progress Notes (Signed)
Patient discharged home,discharge instructions given and explained to the patient and her husband and they had no additional questions and concerns at this time.Patients IV removed per protocol and patient instructed on care of the operated area and she was safely moved to the car on a wheelchair and was driven home by the husband.

## 2023-07-20 NOTE — Discharge Instructions (Signed)

## 2023-07-20 NOTE — Plan of Care (Signed)

## 2023-07-23 ENCOUNTER — Telehealth: Payer: Self-pay | Admitting: Orthopaedic Surgery

## 2023-07-23 NOTE — Telephone Encounter (Signed)
Kim from [facility] called wanting to ask a question about immobilizer on leg, pt states she need to wear it when walking and when does she need to stop wearing it? And requesting verbal orders for HHPT 3 week 2 CB [phone] Secure VM

## 2023-07-23 NOTE — Discharge Summary (Signed)
Physician Discharge Summary  Patient ID: Lydia [name] MRN: [id number] DOB/AGE: Feb 13, 1966 57 y.o.  Admit date: 07/18/2023 Discharge date: 07/20/2023  Admission Diagnoses: Right knee primary osteoarthritis  Discharge Diagnoses:  Principal Problem:   S/P total knee arthroplasty, right Active Problems:   S/P total knee arthroplasty   Unilateral primary osteoarthritis, right knee   Discharged Condition: good  Hospital Course: Patient medical right total knee arthroplasty.  Postop day 1 patient was sedated difficulty with the ambulation with therapy only walked 3 steps.  Pain medication was back down she had been on pain management prior to admission using Suboxone which she states she did stop for surgery.  Postop day 2 she made good progress was ambulatory with therapy and a walker and was discharged home.  Home physical therapy x 2 weeks then outpatient therapy.  Consults: PT and OT.  Significant Diagnostic Studies: Routine admission lab.  PCR was negative.  Treatments: Patient underwent total knee arthroplasty under spinal anesthesia preoperative adductor block Exparel Marcaine.  Use of a CPM ice machine PT OT consults.  Safe ambulation at time of discharge.  Office follow-up 1 week.  Discharge Exam: Blood pressure 119/81, pulse 71, temperature 98.9 F (37.2 C), temperature source Oral, resp. rate 17, height 5' (1.524 m), weight 77.6 kg, SpO2 97%. Dressing was intact.  Knee swelling was mild.  Disposition: Discharge disposition: 01-Home or Self Care        Allergies as of 07/20/2023       Reactions   Nsaids Nausea Only, Other (See Comments)   Heartburn        Medication List     STOP taking these medications    aspirin EC 325 MG tablet   Buprenorphine HCl-Naloxone HCl 8-2 MG Film       TAKE these medications    albuterol 108 (90 Base) MCG/ACT inhaler Commonly known as: VENTOLIN HFA Inhale 2 puffs into the lungs every 6 (six) hours as needed for wheezing  or shortness of breath.   amLODipine 2.5 MG tablet Commonly known as: NORVASC Take 2.5 mg by mouth daily.   apixaban 5 MG Tabs tablet Commonly known as: ELIQUIS Take 5 mg by mouth 2 (two) times daily.   atorvastatin 80 MG tablet Commonly known as: LIPITOR Take 80 mg by mouth daily.   benztropine 0.5 MG tablet Commonly known as: COGENTIN Take 0.5 mg by mouth 2 (two) times daily.   Breztri Aerosphere 160-9-4.8 MCG/ACT Aero Generic drug: Budeson-Glycopyrrol-Formoterol Inhale 2 puffs into the lungs 2 (two) times daily.   cetirizine 10 MG tablet Commonly known as: ZYRTEC Take 10 mg by mouth at bedtime.   cyclobenzaprine 10 MG tablet Commonly known as: FLEXERIL Take 10 mg by mouth 3 (three) times daily as needed for muscle spasms.   ezetimibe 10 MG tablet Commonly known as: ZETIA Take 10 mg by mouth daily.   famotidine 20 MG tablet Commonly known as: PEPCID Take 20 mg by mouth 2 (two) times daily.   fenofibrate 145 MG tablet Commonly known as: TRICOR Take 145 mg by mouth daily.   ferrous sulfate 325 (65 FE) MG tablet Take 325 mg by mouth daily.   fluticasone 50 MCG/ACT nasal spray Commonly known as: FLONASE Place 2 sprays into both nostrils daily as needed for allergies.   folic acid 1 MG tablet Commonly known as: FOLVITE Take 1 mg by mouth daily.   hydrochlorothiazide 25 MG tablet Commonly known as: HYDRODIURIL Take 25 mg by mouth daily.   magnesium oxide  400 MG tablet Commonly known as: MAG-OX Take 400 mg by mouth 2 (two) times daily.   metoprolol tartrate 25 MG tablet Commonly known as: LOPRESSOR Take 25 mg by mouth 2 (two) times daily.   nitroGLYCERIN 0.4 MG SL tablet Commonly known as: NITROSTAT Place 0.4 mg under the tongue every 5 (five) minutes as needed for chest pain.   Nurtec 75 MG Tbdp Generic drug: Rimegepant Sulfate Take 75 mg by mouth daily as needed (migraines).   nystatin powder Commonly known as: MYCOSTATIN/NYSTOP Apply 1  Application topically 3 (three) times daily as needed (irritation).   ondansetron 4 MG disintegrating tablet Commonly known as: ZOFRAN-ODT Take 4 mg by mouth every 8 (eight) hours as needed for nausea or vomiting.   oxyCODONE-acetaminophen 10-325 MG tablet Commonly known as: Percocet Take 1 tablet by mouth every 6 (six) hours as needed for pain.   OXYGEN Inhale 4 L into the lungs at bedtime.   pantoprazole 40 MG tablet Commonly known as: PROTONIX Take 40 mg by mouth 2 (two) times daily.   potassium chloride SA 20 MEQ tablet Commonly known as: KLOR-CON M Take 20 mEq by mouth 2 (two) times daily.   risperiDONE 3 MG tablet Commonly known as: RISPERDAL Take 3 mg by mouth at bedtime.   sucralfate 1 g tablet Commonly known as: CARAFATE Take 1 g by mouth 3 (three) times daily.   venlafaxine XR 150 MG 24 hr capsule Commonly known as: EFFEXOR-XR Take 300 mg by mouth daily with breakfast.   Vitamin D (Ergocalciferol) 1.25 MG (50000 UNIT) Caps capsule Commonly known as: DRISDOL Take 50,000 Units by mouth every 30 (thirty) days.        Follow-up Information     Eldred Manges, MD Follow up in 1 week(s).   Specialty: Orthopedic Surgery Contact information: [address] [place] [address] [phone]                 Signed: Eldred Manges 07/23/2023, 8:49 AM

## 2023-07-24 ENCOUNTER — Encounter: Payer: 59 | Admitting: Orthopaedic Surgery

## 2023-07-24 NOTE — Telephone Encounter (Signed)
Lvm for therapist advising

## 2023-07-25 ENCOUNTER — Telehealth: Payer: Self-pay

## 2023-07-25 NOTE — Telephone Encounter (Signed)
Patient called stating that she got her dressing wet.  Asked patient if dressing was soaked, she stated no and had taken cloth to wipe it dry and that dressing was still intact.  Patient has an appointment on 07/27/2023.  Advised to call back with any concerns or if dressing comes off.

## 2023-07-27 ENCOUNTER — Other Ambulatory Visit (INDEPENDENT_AMBULATORY_CARE_PROVIDER_SITE_OTHER): Payer: 59

## 2023-07-27 ENCOUNTER — Other Ambulatory Visit: Payer: Self-pay

## 2023-07-27 ENCOUNTER — Ambulatory Visit (INDEPENDENT_AMBULATORY_CARE_PROVIDER_SITE_OTHER): Payer: 59 | Admitting: Orthopaedic Surgery

## 2023-07-27 VITALS — BP 90/62 | HR 84

## 2023-07-27 DIAGNOSIS — Z96651 Presence of right artificial knee joint: Secondary | ICD-10-CM

## 2023-07-27 MED ORDER — OXYCODONE-ACETAMINOPHEN 10-325 MG PO TABS: 1.0000 | ORAL_TABLET | Freq: Four times a day (QID) | ORAL | 0 refills | Status: AC | PRN

## 2023-07-27 NOTE — Progress Notes (Signed)
Post-Op Visit Note   Patient: Lydia [name]           Date of Birth: April 17, 1966           MRN: [id number] Visit Date: 07/27/2023 PCP: Erskine Emery, NP   Assessment & Plan: Postop total knee arthroplasty.  She has bruising from the tourniquet she is on Eliquis.  Swelling in the knee slow will progress on range of motion as expected with postop swelling since she is on the blood thinner.  She has some bruising 1 small area blister formation proximal to the incision from the Aquacel or Mepilex.  She had subcuticular closure incision looks good.  X-rays look good.  Chief Complaint:  Chief Complaint  Patient presents with   Right Knee - Routine Post Op   Visit Diagnoses:  1. Status post right knee replacement     Plan:  Return in 4 weeks for recheck.  Will transition outpatient therapy at [facility] in [place] since she lives in [place].  Percocet was renewed.  He is to take care of  Follow-Up Instructions: Return in about 4 weeks (around 08/24/2023).   Orders:  Orders Placed This Encounter  Procedures   XR Knee 1-2 Views Right   Meds ordered this encounter  Medications   oxyCODONE-acetaminophen (PERCOCET) 10-325 MG tablet    Sig: Take 1 tablet by mouth every 6 (six) hours as needed for pain.    Dispense:  40 tablet    Refill:  0    POST OP PAIN. STOPPED  SUBOXONE. WILL BE USING THIS POST OP AND WEANING OFF.    Imaging: XR Knee 1-2 Views Right  Result Date: 07/27/2023 Standing AP both knees lateral right knee demonstrates new total knee arthroplasty good position alignment no subsidence. Impression: Satisfactory right total knee arthroplasty postop images.   PMFS History: Patient Active Problem List   Diagnosis Date Noted   OA (osteoarthritis) of knee 07/18/2023   S/P total knee arthroplasty, right 07/18/2023   Unilateral primary osteoarthritis, right knee 05/08/2023   Hallucination 09/15/2021   Depression 09/15/2021   Other intervertebral disc  degeneration, lumbar region 04/14/2021   S/P total knee arthroplasty 01/01/2020   CAD (coronary artery disease) 02/01/2016   Sepsis (HCC) 01/29/2016   Respiratory failure (HCC) 01/29/2016   Influenza    Chronic obstructive pulmonary disease with acute exacerbation (HCC)    Cough    Past Medical History:  Diagnosis Date   Anemia    Anginal pain (HCC)    per patient   Anxiety    per patient    Asthma    Bipolar disorder (HCC)    CHF (congestive heart failure) (HCC)    COPD (chronic obstructive pulmonary disease) (HCC)    Coronary artery disease    Depression    Dyspnea    per patient due to COPD, and with walking long distances per patient   Dysrhythmia    Fibromyalgia    GERD (gastroesophageal reflux disease)    Heart murmur    History of blood transfusion    History of hiatal hernia    Hypertension    Mitral regurgitation    Moderate MR 03/2019   On supplemental oxygen by nasal cannula    per patient 2L at night   Pneumonia    Rheumatoid arthritis (HCC)    per patient diagnosed within the past 2 years, stated on 12/24/2019   Sleep apnea    per patient diagnosed about 3-4  years ago and does not use a CPAP at night    Family History  Problem Relation Age of Onset   Cancer Father    Breast cancer Neg Hx     Past Surgical History:  Procedure Laterality Date   ABDOMINAL HYSTERECTOMY     CARDIAC CATHETERIZATION     CORONARY ANGIOPLASTY     DES RCA x3 01/05/16   KNEE SURGERY Left    TONSILLECTOMY     TOTAL KNEE ARTHROPLASTY Left 12/31/2019   Procedure: LEFT TOTAL KNEE ARTHROPLASTY;  Surgeon: Eldred Manges, MD;  Location: [facility] OR;  Service: Orthopedics;  Laterality: Left;   TOTAL KNEE ARTHROPLASTY Right 07/18/2023   Procedure: RIGHT TOTAL KNEE ARTHROPLASTY;  Surgeon: Eldred Manges, MD;  Location: [facility] OR;  Service: Orthopedics;  Laterality: Right;  Needs RNFA   Social History   Occupational History   Not on file  Tobacco Use   Smoking status: Every Day    Current  packs/day: 1.00    Types: Cigarettes   Smokeless tobacco: Never  Vaping Use   Vaping status: Never Used  Substance and Sexual Activity   Alcohol use: No   Drug use: Yes    Frequency: 7.0 times per week    Types: Marijuana    Comment: last time was 07/04/23-   Sexual activity: Not on file

## 2023-07-30 ENCOUNTER — Telehealth: Payer: Self-pay | Admitting: Orthopaedic Surgery

## 2023-07-30 NOTE — Telephone Encounter (Signed)
Called pharmacy and they confirmed it is ready for pickup

## 2023-07-30 NOTE — Telephone Encounter (Signed)
Tried calling. [place] [facility] was on lunch break

## 2023-07-30 NOTE — Telephone Encounter (Signed)
Patient called and said her pharmacy didn't get the medication that was supposed to be called in. It was Oxycodone.CB#[phone]

## 2023-07-30 NOTE — Telephone Encounter (Signed)
Called and advised pt.

## 2023-08-24 ENCOUNTER — Encounter: Payer: Self-pay | Admitting: Orthopaedic Surgery

## 2023-08-24 ENCOUNTER — Ambulatory Visit (INDEPENDENT_AMBULATORY_CARE_PROVIDER_SITE_OTHER): Payer: 59 | Admitting: Orthopaedic Surgery

## 2023-08-24 VITALS — BP 93/61 | HR 63 | Ht 60.0 in | Wt 171.0 lb

## 2023-08-24 DIAGNOSIS — Z96651 Presence of right artificial knee joint: Secondary | ICD-10-CM | POA: Diagnosis not present

## 2023-08-24 NOTE — Progress Notes (Signed)
Post-Op Visit Note   Patient: Lydia [name]           Date of Birth: [birth year]           MRN: [id number] Visit Date: 08/24/2023 PCP: Erskine Emery, NP   Assessment & Plan: Follow-up total knee arthroplasty she states no one ever called her to set up outpatient therapy she is here therapy on her own she lacks about 4 degrees reaching full extension flexes to 120 degrees mild knee swelling.  Not using a cane sometimes her knee buckles.  She had been onOpana 8/2 mg patches for more than a year.  Postop we used Percocet 10/325.  She is off them now.  She still has some aches and pains but is functioning well.  She uses Tylenol now for relief.  She will work on prone positioning to get the last few degrees of extension continue to work on quad strengthening using her purse on her ankle with leg lifts.  She uses her swing and uses her feet to push herself.  Recheck 4 weeks.  Chief Complaint:  Chief Complaint  Patient presents with   Right Knee - Routine Post Op, Follow-up    07/18/2023 Right TKA   Visit Diagnoses:  1. S/P total knee arthroplasty, right     Plan: ROV 4 wks  Follow-Up Instructions: Return in about 4 weeks (around 09/21/2023).   Orders:  No orders of the defined types were placed in this encounter.  No orders of the defined types were placed in this encounter.   Imaging: No results found.  PMFS History: Patient Active Problem List   Diagnosis Date Noted   OA (osteoarthritis) of knee 07/18/2023   S/P total knee arthroplasty, right 07/18/2023   Unilateral primary osteoarthritis, right knee 05/08/2023   Hallucination 09/15/2021   Depression 09/15/2021   Other intervertebral disc degeneration, lumbar region 04/14/2021   S/P total knee arthroplasty 01/01/2020   CAD (coronary artery disease) 02/01/2016   Sepsis (HCC) 01/29/2016   Respiratory failure (HCC) 01/29/2016   Influenza    Chronic obstructive pulmonary disease with acute exacerbation (HCC)    Cough     Past Medical History:  Diagnosis Date   Anemia    Anginal pain (HCC)    per patient   Anxiety    per patient    Asthma    Bipolar disorder (HCC)    CHF (congestive heart failure) (HCC)    COPD (chronic obstructive pulmonary disease) (HCC)    Coronary artery disease    Depression    Dyspnea    per patient due to COPD, and with walking long distances per patient   Dysrhythmia    Fibromyalgia    GERD (gastroesophageal reflux disease)    Heart murmur    History of blood transfusion    History of hiatal hernia    Hypertension    Mitral regurgitation    Moderate MR 03/2019   On supplemental oxygen by nasal cannula    per patient 2L at night   Pneumonia    Rheumatoid arthritis (HCC)    per patient diagnosed within the past 2 years, stated on 12/24/2019   Sleep apnea    per patient diagnosed about 3-4 years ago and does not use a CPAP at night    Family History  Problem Relation Age of Onset   Cancer Father    Breast cancer Neg Hx     Past Surgical History:  Procedure Laterality Date  ABDOMINAL HYSTERECTOMY     CARDIAC CATHETERIZATION     CORONARY ANGIOPLASTY     DES RCA x3 01/05/16   KNEE SURGERY Left    TONSILLECTOMY     TOTAL KNEE ARTHROPLASTY Left 12/31/2019   Procedure: LEFT TOTAL KNEE ARTHROPLASTY;  Surgeon: Eldred Manges, MD;  Location: [facility] OR;  Service: Orthopedics;  Laterality: Left;   TOTAL KNEE ARTHROPLASTY Right 07/18/2023   Procedure: RIGHT TOTAL KNEE ARTHROPLASTY;  Surgeon: Eldred Manges, MD;  Location: [facility] OR;  Service: Orthopedics;  Laterality: Right;  Needs RNFA   Social History   Occupational History   Not on file  Tobacco Use   Smoking status: Every Day    Current packs/day: 1.00    Types: Cigarettes   Smokeless tobacco: Never  Vaping Use   Vaping status: Never Used  Substance and Sexual Activity   Alcohol use: No   Drug use: Yes    Frequency: 7.0 times per week    Types: Marijuana    Comment: last time was 07/04/23-   Sexual activity: Not on  file

## 2023-09-21 ENCOUNTER — Encounter: Payer: 59 | Admitting: Orthopaedic Surgery

## 2023-09-25 ENCOUNTER — Encounter: Payer: 59 | Admitting: Orthopaedic Surgery

## 2023-10-09 ENCOUNTER — Encounter: Payer: 59 | Admitting: Orthopaedic Surgery

## 2024-01-11 ENCOUNTER — Other Ambulatory Visit: Payer: Self-pay | Admitting: Family

## 2024-01-11 DIAGNOSIS — Z1231 Encounter for screening mammogram for malignant neoplasm of breast: Secondary | ICD-10-CM

## 2024-01-18 ENCOUNTER — Encounter

## 2024-03-14 ENCOUNTER — Ambulatory Visit
Admission: RE | Admit: 2024-03-14 | Discharge: 2024-03-14 | Disposition: A | Discharge status: Home / Self Care | Source: Ambulatory Visit | Attending: Family | Admitting: Family

## 2024-03-14 DIAGNOSIS — Z1231 Encounter for screening mammogram for malignant neoplasm of breast: Secondary | ICD-10-CM

## 2024-09-01 ENCOUNTER — Encounter: Payer: Self-pay | Admitting: Radiology

## 2024-10-01 ENCOUNTER — Other Ambulatory Visit (HOSPITAL_BASED_OUTPATIENT_CLINIC_OR_DEPARTMENT_OTHER): Payer: Self-pay

## 2024-10-01 DIAGNOSIS — Z122 Encounter for screening for malignant neoplasm of respiratory organs: Secondary | ICD-10-CM

## 2024-10-07 ENCOUNTER — Ambulatory Visit (HOSPITAL_BASED_OUTPATIENT_CLINIC_OR_DEPARTMENT_OTHER): Admitting: Radiology

## 2024-10-29 ENCOUNTER — Ambulatory Visit (HOSPITAL_BASED_OUTPATIENT_CLINIC_OR_DEPARTMENT_OTHER): Admitting: Radiology

## 2024-11-20 ENCOUNTER — Encounter: Payer: Self-pay | Admitting: Orthopedic Surgery

## 2024-11-20 ENCOUNTER — Other Ambulatory Visit: Payer: Self-pay

## 2024-11-20 ENCOUNTER — Ambulatory Visit (INDEPENDENT_AMBULATORY_CARE_PROVIDER_SITE_OTHER): Admitting: Orthopedic Surgery

## 2024-11-20 DIAGNOSIS — M5442 Lumbago with sciatica, left side: Secondary | ICD-10-CM

## 2024-11-20 MED ORDER — PREDNISONE 10 MG PO TABS: 10.0000 mg | ORAL_TABLET | Freq: Every day | ORAL | 0 refills | Status: AC

## 2024-11-20 NOTE — Progress Notes (Signed)
 "  Office Visit Note   Patient: Lydia [name]           Date of Birth: January 14, 1966           MRN: [id number] Visit Date: 11/20/2024              Requested by: Silvano Angeline FALCON, NP [address] [place],  [place] [address] PCP: Silvano Angeline FALCON, NP  Chief Complaint  Patient presents with   Left Leg - Pain      HPI: Discussed the use of AI scribe software for clinical note transcription with the patient, who gave verbal consent to proceed.  History of Present Illness Lydia [name] is a 59 year old female with degenerative lumbar scoliosis who presents with low back pain radiating down her left leg.  She reports pain originating in the lower lumbar region, radiating along the lateral aspect of the left leg to the mid-calf. The pain is exacerbated by sitting, standing, and ambulation, and is associated with difficulty lifting the left leg. She has trialed acetaminophen  with unclear benefit.  She reports she has not had an x-ray of her back.  Her past surgical history includes a procedure described as esophageal mobilization, possibly a laparoscopic Nissen fundoplication. She notes a history of heart problems without further detail. She currently smokes cigarettes.     Assessment & Plan: Visit Diagnoses:  1. Acute left-sided low back pain with left-sided sciatica     Plan: Assessment and Plan Assessment & Plan Acute left-sided low back pain with left-sided sciatica Acute left-sided low back pain radiating to the left leg, consistent with sciatica, likely due to nerve root irritation from inflammation. Tobacco use contributes. Goal: reduce inflammation and neuropathic pain. - Ordered lumbar spine x-ray. - Prescribed low-dose prednisone . - Advised follow-up in four weeks.  Degenerative lumbar scoliosis with osteophytes Chronic degenerative lumbar scoliosis with osteophytes contributing to back pain and nerve irritation. - Reviewed lumbar spine x-ray findings with  her.  Possible abdominal aortic aneurysm Lumbar spine radiographs suggest a possible 3 cm abdominal aortic aneurysm with aortic calcification. Cardiovascular disease and prior abdominal surgery necessitate further evaluation. - Ordered abdominal aorta ultrasound. - Referred to vascular surgery.      Follow-Up Instructions: No follow-ups on file.   Ortho Exam  Patient is alert, oriented, no adenopathy, well-dressed, normal affect, normal respiratory effort. Physical Exam MUSCULOSKELETAL: Positive straight leg raise on the left. Decreased strength with plantar flexion and dorsiflexion of the left foot.      Imaging: No results found. No images are attached to the encounter.  Labs: Lab Results  Component Value Date   ESRSEDRATE 19 09/15/2021   CRP 6 09/15/2021   REPTSTATUS 02/03/2016 FINAL 01/29/2016   CULT  01/29/2016    NO GROWTH 5 DAYS Performed at [facility]      Lab Results  Component Value Date   ALBUMIN 3.6 (L) 09/15/2021   ALBUMIN 3.6 12/24/2019   ALBUMIN 3.0 (L) 01/30/2016    Lab Results  Component Value Date   MG 1.9 11/07/2010   No results found for: VD25OH  No results found for: PREALBUMIN    Latest Ref Rng & Units 07/20/2023    9:31 AM 07/19/2023    4:11 AM 07/06/2023   10:30 AM  CBC EXTENDED  WBC 4.0 - 10.5 K/uL 7.7  7.9  6.7   RBC 3.87 - 5.11 MIL/uL 3.08  3.36  3.83   Hemoglobin 12.0 - 15.0 g/dL 9.8  10.8  12.6   HCT 36.0 - 46.0 % 30.0  33.6  39.9   Platelets 150 - 400 K/uL 172  186  231      There is no height or weight on file to calculate BMI.  Orders:  Orders Placed This Encounter  Procedures   XR Lumbar Spine 2-3 Views   Ambulatory referral to Vascular Surgery   Meds ordered this encounter  Medications   predniSONE  (DELTASONE ) 10 MG tablet    Sig: Take 1 tablet (10 mg total) by mouth daily with breakfast.    Dispense:  30 tablet    Refill:  0     Procedures: No procedures performed  Clinical Data: No  additional findings.  ROS:  All other systems negative, except as noted in the HPI. Review of Systems  Objective: Vital Signs: There were no vitals taken for this visit.  Specialty Comments:  No specialty comments available.  PMFS History: Patient Active Problem List   Diagnosis Date Noted   OA (osteoarthritis) of knee 07/18/2023   S/P total knee arthroplasty, right 07/18/2023   Unilateral primary osteoarthritis, right knee 05/08/2023   Hallucination 09/15/2021   Depression 09/15/2021   Other intervertebral disc degeneration, lumbar region 04/14/2021   S/P total knee arthroplasty 01/01/2020   CAD (coronary artery disease) 02/01/2016   Sepsis (HCC) 01/29/2016   Respiratory failure (HCC) 01/29/2016   Influenza    Chronic obstructive pulmonary disease with acute exacerbation (HCC)    Cough    Past Medical History:  Diagnosis Date   Anemia    Anginal pain    per patient   Anxiety    per patient    Asthma    Bipolar disorder (HCC)    CHF (congestive heart failure) (HCC)    COPD (chronic obstructive pulmonary disease) (HCC)    Coronary artery disease    Depression    Dyspnea    per patient due to COPD, and with walking long distances per patient   Dysrhythmia    Fibromyalgia    GERD (gastroesophageal reflux disease)    Heart murmur    History of blood transfusion    History of hiatal hernia    Hypertension    Mitral regurgitation    Moderate MR 03/2019   On supplemental oxygen by nasal cannula    per patient 2L at night   Pneumonia    Rheumatoid arthritis (HCC)    per patient diagnosed within the past 2 years, stated on 12/24/2019   Sleep apnea    per patient diagnosed about 3-4 years ago and does not use a CPAP at night    Family History  Problem Relation Age of Onset   Cancer Father    Breast cancer Neg Hx     Past Surgical History:  Procedure Laterality Date   ABDOMINAL HYSTERECTOMY     CARDIAC CATHETERIZATION     CORONARY ANGIOPLASTY     DES RCA x3  01/05/16   KNEE SURGERY Left    TONSILLECTOMY     TOTAL KNEE ARTHROPLASTY Left 12/31/2019   Procedure: LEFT TOTAL KNEE ARTHROPLASTY;  Surgeon: Barbarann Oneil BROCKS, MD;  Location: [facility] OR;  Service: Orthopedics;  Laterality: Left;   TOTAL KNEE ARTHROPLASTY Right 07/18/2023   Procedure: RIGHT TOTAL KNEE ARTHROPLASTY;  Surgeon: Barbarann Oneil BROCKS, MD;  Location: [facility] OR;  Service: Orthopedics;  Laterality: Right;  Needs RNFA   Social History   Occupational History   Not on file  Tobacco Use  Smoking status: Every Day    Current packs/day: 1.00    Types: Cigarettes   Smokeless tobacco: Never  Vaping Use   Vaping status: Never Used  Substance and Sexual Activity   Alcohol use: No   Drug use: Yes    Frequency: 7.0 times per week    Types: Marijuana    Comment: last time was 07/04/23-   Sexual activity: Not on file         "

## 2024-12-18 ENCOUNTER — Ambulatory Visit: Admitting: Orthopedic Surgery

## 2024-12-31 ENCOUNTER — Ambulatory Visit (HOSPITAL_COMMUNITY)

## 2024-12-31 ENCOUNTER — Encounter: Admitting: Vascular Surgery
